# Patient Record
Sex: Female | Born: 1940 | Race: Black or African American | Hispanic: No | Marital: Single | Smoking: Never smoker
Health system: Southern US, Community
[De-identification: ages and names within clinical notes are randomized; demographics above are authoritative.]

## PROBLEM LIST (undated history)

## (undated) DIAGNOSIS — I219 Acute myocardial infarction, unspecified: Secondary | ICD-10-CM

## (undated) DIAGNOSIS — K851 Biliary acute pancreatitis without necrosis or infection: Secondary | ICD-10-CM

## (undated) DIAGNOSIS — I739 Peripheral vascular disease, unspecified: Secondary | ICD-10-CM

## (undated) DIAGNOSIS — D649 Anemia, unspecified: Secondary | ICD-10-CM

## (undated) DIAGNOSIS — I2699 Other pulmonary embolism without acute cor pulmonale: Secondary | ICD-10-CM

## (undated) DIAGNOSIS — F209 Schizophrenia, unspecified: Secondary | ICD-10-CM

## (undated) DIAGNOSIS — Q639 Congenital malformation of kidney, unspecified: Secondary | ICD-10-CM

## (undated) DIAGNOSIS — M1990 Unspecified osteoarthritis, unspecified site: Secondary | ICD-10-CM

## (undated) DIAGNOSIS — I10 Essential (primary) hypertension: Secondary | ICD-10-CM

## (undated) HISTORY — DX: Congenital malformation of kidney, unspecified: Q63.9

## (undated) HISTORY — PX: UMBILICAL HERNIA REPAIR: SHX196

## (undated) HISTORY — DX: Schizophrenia, unspecified: F20.9

## (undated) HISTORY — PX: KNEE SURGERY: SHX244

## (undated) HISTORY — PX: COLONOSCOPY: SHX174

## (undated) HISTORY — DX: Other pulmonary embolism without acute cor pulmonale: I26.99

## (undated) HISTORY — PX: ABDOMINAL HYSTERECTOMY: SHX81

## (undated) HISTORY — DX: Biliary acute pancreatitis without necrosis or infection: K85.10

## (undated) HISTORY — PX: CHOLECYSTECTOMY: SHX55

---

## 2002-12-30 ENCOUNTER — Ambulatory Visit (HOSPITAL_COMMUNITY): Admission: RE | Admit: 2002-12-30 | Discharge: 2002-12-30 | Payer: Self-pay | Admitting: Pulmonary Disease

## 2003-04-01 ENCOUNTER — Emergency Department (HOSPITAL_COMMUNITY): Admission: EM | Admit: 2003-04-01 | Discharge: 2003-04-01 | Payer: Self-pay | Admitting: Emergency Medicine

## 2003-04-01 ENCOUNTER — Encounter: Payer: Self-pay | Admitting: Emergency Medicine

## 2003-11-28 ENCOUNTER — Emergency Department (HOSPITAL_COMMUNITY): Admission: EM | Admit: 2003-11-28 | Discharge: 2003-11-28 | Payer: Self-pay | Admitting: Emergency Medicine

## 2003-12-12 ENCOUNTER — Ambulatory Visit (HOSPITAL_COMMUNITY): Admission: RE | Admit: 2003-12-12 | Discharge: 2003-12-12 | Payer: Self-pay | Admitting: Pulmonary Disease

## 2004-01-04 ENCOUNTER — Ambulatory Visit (HOSPITAL_COMMUNITY): Admission: RE | Admit: 2004-01-04 | Discharge: 2004-01-04 | Payer: Self-pay | Admitting: Orthopedic Surgery

## 2004-01-30 ENCOUNTER — Ambulatory Visit (HOSPITAL_COMMUNITY): Admission: RE | Admit: 2004-01-30 | Discharge: 2004-01-30 | Payer: Self-pay | Admitting: Pulmonary Disease

## 2004-04-19 ENCOUNTER — Inpatient Hospital Stay (HOSPITAL_COMMUNITY): Admission: AD | Admit: 2004-04-19 | Discharge: 2004-04-21 | Payer: Self-pay | Admitting: Internal Medicine

## 2004-11-17 DIAGNOSIS — K851 Biliary acute pancreatitis without necrosis or infection: Secondary | ICD-10-CM

## 2004-11-17 HISTORY — DX: Biliary acute pancreatitis without necrosis or infection: K85.10

## 2005-02-17 ENCOUNTER — Inpatient Hospital Stay (HOSPITAL_COMMUNITY): Admission: EM | Admit: 2005-02-17 | Discharge: 2005-02-26 | Payer: Self-pay | Admitting: Emergency Medicine

## 2005-02-18 ENCOUNTER — Ambulatory Visit: Payer: Self-pay | Admitting: Internal Medicine

## 2005-03-19 ENCOUNTER — Ambulatory Visit (HOSPITAL_COMMUNITY): Admission: RE | Admit: 2005-03-19 | Discharge: 2005-03-19 | Payer: Self-pay | Admitting: Internal Medicine

## 2005-03-24 ENCOUNTER — Ambulatory Visit (HOSPITAL_COMMUNITY): Admission: RE | Admit: 2005-03-24 | Discharge: 2005-03-24 | Payer: Self-pay | Admitting: General Surgery

## 2005-04-11 ENCOUNTER — Observation Stay (HOSPITAL_COMMUNITY): Admission: RE | Admit: 2005-04-11 | Discharge: 2005-04-12 | Payer: Self-pay | Admitting: General Surgery

## 2006-02-18 ENCOUNTER — Emergency Department (HOSPITAL_COMMUNITY): Admission: EM | Admit: 2006-02-18 | Discharge: 2006-02-18 | Payer: Self-pay | Admitting: Emergency Medicine

## 2006-04-04 ENCOUNTER — Emergency Department (HOSPITAL_COMMUNITY): Admission: EM | Admit: 2006-04-04 | Discharge: 2006-04-04 | Payer: Self-pay | Admitting: Emergency Medicine

## 2007-03-04 ENCOUNTER — Ambulatory Visit (HOSPITAL_COMMUNITY): Admission: RE | Admit: 2007-03-04 | Discharge: 2007-03-04 | Payer: Self-pay | Admitting: Podiatry

## 2007-09-14 ENCOUNTER — Ambulatory Visit (HOSPITAL_COMMUNITY): Admission: RE | Admit: 2007-09-14 | Discharge: 2007-09-14 | Payer: Self-pay | Admitting: Ophthalmology

## 2008-01-05 ENCOUNTER — Ambulatory Visit (HOSPITAL_COMMUNITY): Admission: RE | Admit: 2008-01-05 | Discharge: 2008-01-05 | Payer: Self-pay | Admitting: Pulmonary Disease

## 2008-09-12 ENCOUNTER — Ambulatory Visit: Admission: RE | Admit: 2008-09-12 | Discharge: 2008-09-12 | Payer: Self-pay | Admitting: Pulmonary Disease

## 2008-10-23 ENCOUNTER — Ambulatory Visit (HOSPITAL_COMMUNITY): Admission: RE | Admit: 2008-10-23 | Discharge: 2008-10-23 | Payer: Self-pay | Admitting: Neurology

## 2009-08-06 ENCOUNTER — Ambulatory Visit (HOSPITAL_COMMUNITY): Admission: RE | Admit: 2009-08-06 | Discharge: 2009-08-06 | Payer: Self-pay | Admitting: Pulmonary Disease

## 2010-04-04 ENCOUNTER — Ambulatory Visit (HOSPITAL_COMMUNITY)
Admission: RE | Admit: 2010-04-04 | Discharge: 2010-04-04 | Payer: Self-pay | Source: Home / Self Care | Admitting: Ophthalmology

## 2010-07-02 ENCOUNTER — Ambulatory Visit (HOSPITAL_COMMUNITY): Admission: RE | Admit: 2010-07-02 | Discharge: 2010-07-02 | Payer: Self-pay | Admitting: Pulmonary Disease

## 2010-09-03 ENCOUNTER — Ambulatory Visit (HOSPITAL_COMMUNITY): Admission: RE | Admit: 2010-09-03 | Discharge: 2010-09-03 | Payer: Self-pay | Admitting: Pulmonary Disease

## 2010-12-08 ENCOUNTER — Encounter: Payer: Self-pay | Admitting: Orthopedic Surgery

## 2011-04-04 NOTE — Procedures (Signed)
NAME:  Elgersma, Greer                ACCOUNT NO.:  [id number]   MEDICAL RECORD NO.:  [id number]          PATIENT TYPE:  OUT   LOCATION:  SLEEP                         FACILITY:  [facility]   PHYSICIAN:  Kofi A. Gerilyn Pilgrim, M.D. DATE OF BIRTH:  [birth year]   DATE OF PROCEDURE:  DATE OF DISCHARGE:                             SLEEP DISORDER REPORT   NOCTURNAL POLYSOMNOGRAPHY   REFERRING PHYSICIAN:  Edward L. Juanetta Gosling, MD   RECORDING DATE:  September 12, 2008.   INDICATION:  A 70 year old lady who presents with marked daytime  sleepiness snoring and has been evaluated for obstructive sleep apnea  syndrome.   MEDICATION:  Home O2.   Epworth sleepiness scale is 21.  BMI 25.   ARCHITECTURAL SUMMARY:  The total recording time is 400 minutes.  The  sleep efficiency 78%.  Sleep latency 9.5 minutes.  REM latency 83  minutes. Stage N1 8%, N2 75%, N3 2%, and REM sleep 15%.   RESPIRATORY SUMMARY:  The baseline oxygen saturation is 98 and the  lowest saturation is 92%.  The AHI is 8.   LIMB MOVEMENT SUMMARY:  PLM index 0.   ELECTROCARDIOGRAM SUMMARY:  Average heart rate is 57 with isolated PVCs  observed.   IMPRESSION:  Mild obstructive sleep apnea syndrome not requiring  positive pressure.   RECOMMENDATION:  Given the significant hypersomnia, we would suggest  sleep consultation.   Thanks for this referral.      Kofi A. Gerilyn Pilgrim, M.D.  Electronically Signed    KAD/MEDQ  D:  09/16/2008  T:  09/17/2008  Job:  [id number]

## 2011-04-04 NOTE — Group Therapy Note (Signed)
NAME:  [name] [name], [name]                ACCOUNT NO.:  [id number]   MEDICAL RECORD NO.:  [id number]          PATIENT TYPE:  INP   LOCATION:  A323                          FACILITY:  [facility]   PHYSICIAN:  Edward L. Juanetta Gosling, M.D.DATE OF BIRTH:  06-15-1941   DATE OF PROCEDURE:  02/24/2005  DATE OF DISCHARGE:                                   PROGRESS NOTE   [name] was admitted with biliary pancreatitis.  Thus far she has had  her stent placed and seems to be doing better but she is still not eating.  I am wondering if some of the problem is the fact that she has been off of  her antipsychotic so I am going to restart her Zyprexa.  On multiple  occasions she has been in my office complaining that she has lost weight,  has not eaten, etc. and it turns off that she is off of her antipsychotic  medication and that may be the problem here.  At any rate, I think it is  reasonable to go ahead and start her back on her Zyprexa and see if that  helps.  I have gone ahead and ordered that for today.   PHYSICAL EXAMINATION:  ABDOMEN:  Very soft.  CHEST:  Clear.  HEART:  Regular.  VITAL SIGNS:  Temperature 98.1, pulse 67, respirations 18, blood pressure  119/68.   LABORATORIES:  Lipase 89, amylase 113.  CBC:  White count 6400, hemoglobin  13.3, hematocrit 38.4.  Comprehensive metabolic panel shows sodium 128.  Liver functions are normal.   ASSESSMENT:  I am wondering if some of this, as mentioned, is related to not  taking her antipsychotic so I am going to restart that today.      ELH/MEDQ  D:  02/24/2005  T:  02/24/2005  Job:  [id number]

## 2011-04-04 NOTE — Group Therapy Note (Signed)
NAME:  [name], [name]                ACCOUNT NO.:  [id number]   MEDICAL RECORD NO.:  [id number]          PATIENT TYPE:  INP   LOCATION:  A323                          FACILITY:  [facility]   PHYSICIAN:  Edward L. Juanetta Gosling, M.D.DATE OF BIRTH:  Nov 21, 1940   DATE OF PROCEDURE:  DATE OF DISCHARGE:                                   PROGRESS NOTE   PROBLEM LIST:  Biliary pancreatitis.   SUBJECTIVE:  [name] [name] had a stent placed yesterday after ERCP and  initially felt better, but this morning, she says she is tired and weak.  She has no other complaints.  She is not really nauseated right now.   PHYSICAL EXAMINATION:  VITAL SIGNS:  Temperature 98.1, pulse 67,  respirations 20, blood pressure 124/57.  O2 sat 94% on room air.  ABDOMEN:  Soft.  EXTREMITIES:  Showed no edema.  CENTRAL NERVOUS SYSTEM:  Shows she is mildly anxious.   LABORATORY:  This morning, SGOT is 43, down from 79; SGPT 79, down from 114;  her albumin is also down to 2.7; amylase is 297, down from 574.   ASSESSMENT:  She is much improved.   PLAN:  To try to get her up and move around.  I am going to discontinue the  IV fluids, and I am hopeful that she will be able to be discharged soon.      ELH/MEDQ  D:  02/19/2005  T:  02/19/2005  Job:  [id number]

## 2011-04-04 NOTE — Discharge Summary (Signed)
NAME:  [name] [name], [name] [name]                ACCOUNT NO.:  [id number]   MEDICAL RECORD NO.:  [id number]          PATIENT TYPE:  INP   LOCATION:  A323                          FACILITY:  [facility]   PHYSICIAN:  Edward L. Juanetta Gosling, M.D.DATE OF BIRTH:  Oct 02, 1941   DATE OF ADMISSION:  02/17/2005  DATE OF DISCHARGE:  04/12/2006LH                                 DISCHARGE SUMMARY   DISCHARGE DIAGNOSES:  1.  Biliary pancreatitis.  2.  Schizophrenia.  3.  Chronic low back pain.  4.  History of pulmonary embolism.   HISTORY OF PRESENT ILLNESS:  [name] [name] is a 70 year old who came to the  emergency room complaining of abdominal discomfort and it was noted at that  time to have markedly elevated amylase, lipase, and had an ultrasound that  showed what appeared to be a stone impacted in the gallbladder.  She was  admitted because of all this, given IV fluids and antibiotics and had GI  consultation.  After GI consultation, she underwent a ERCP and stent  placement by Dr. Jena Gauss which improved her abdominal discomfort.  She was  somewhat slow to come around.  It was felt that at least part of that was  because she was off her chronic antipsychotic which is Zyprexa.  Zyprexa was  reinstituted and she got much better and was discharged home on April 12 on:   DISCHARGE MEDICATIONS:  1.  Zyprexa 5 mg at night.  2.  She is going to continue with her previous Vicodin one q.i.d. p.r.n.      pain.   She is going to be evaluated with a followup appointment at the GI office  for possible repeat ERCP and stent removal.   On admission, her physical examination showed that she was a thin female who  was in very marked abdominal distress and had also fairly marked tenderness  in the abdomen.      ELH/MEDQ  D:  02/26/2005  T:  02/27/2005  Job:  [id number]

## 2011-04-04 NOTE — Discharge Summary (Signed)
NAME:  [name] [name], [name] [name]                          ACCOUNT NO.:  [id number]   MEDICAL RECORD NO.:  [id number]                   PATIENT TYPE:  INP   LOCATION:  A212                                 FACILITY:  [facility]   PHYSICIAN:  Kingsley Callander. Ouida Sills, M.D.                  DATE OF BIRTH:  July 12, 1941   DATE OF ADMISSION:  04/19/2004  DATE OF DISCHARGE:  04/21/2004                                 DISCHARGE SUMMARY   DISCHARGE DIAGNOSES:  1. Syncope.  2. Weight loss.  3. Chronic back pain.  4. Elevated transaminases.  5. Possible urinary tract infection.   PROCEDURE:  1. CT scan of the abdomen and pelvis.  2. CT scan of the head.   HOSPITAL COURSE:  This patient is a 70 year old female who presented after a  syncopal episode.  There were no subsequent neurologic deficits.  A CT scan  of the head was negative.  She remained neurologically stable.  There was no  evidence of seizure activity.  Cardiac monitoring revealed no evidence of  dysrhythmia.  Her CBC was normal.  Her electrolytes were normal.  Her AST  was 42 and ALT was 48.  She complained of back pain which has evidentially  been a chronic problem.  She has used hydrocodone on a p.r.n. basis.   She describes a marked weight loss in recent months.  In light of her weight  loss and abnormal liver function studies, a CT scan of the abdomen and  pelvis was obtained which revealed no abnormality to explain her weight  loss.  It was discovered that she had previously had a CT scan of the chest,  abdomen, and pelvis earlier this year.  She had denied having any other  evaluation.  A hepatitis C antibody and a hepatitis B surface antigen have  been ordered to further evaluate her elevated transaminases.  On the morning  of June 5 though she was felt to be stable for discharge, with further  investigation into her weight loss as an outpatient.  She was advised to  have a colonoscopy which she had not previously had.  She also had a few  bacteria  and 7-10 white cells on urinalysis.  A urine culture is pending.  She is asymptomatic in this regard.   DISCHARGE MEDICATIONS:  Vicodin 1-2 q.6 hours p.r.n.   LABORATORY DATA:  Her TSH was normal at 0.4.     ___________________________________________                                         Kingsley Callander. Ouida Sills, M.D.   ROF/MEDQ  D:  04/21/2004  T:  04/21/2004  Job:  [id number]   cc:   Ramon Dredge L. Juanetta Gosling, M.D.  [address]  [place]  [place] [address]  Fax: [phone]

## 2011-04-04 NOTE — Group Therapy Note (Signed)
NAME:  [name] [name], [name] [name]                ACCOUNT NO.:  [id number]   MEDICAL RECORD NO.:  [id number]          PATIENT TYPE:  INP   LOCATION:  A323                          [facility]ILITY:  [facility]   PHYSICIAN:  Edward L. Juanetta Gosling, M.D.DATE OF BIRTH:  19-Jun-1941   DATE OF PROCEDURE:  02/22/2005  DATE OF DISCHARGE:                                   PROGRESS NOTE   PROBLEM:  Biliary pancreatitis.   SUBJECTIVE:  [name] [name] says she is okay and has no new complaints. She is  still nauseated. She has not had a bowel movement for several days.   OBJECTIVE:  VITAL SIGNS:  Her temperature is 98.4, pulse 67, respirations  18, blood pressure 125/81.  CHEST:  Her chest is clear.  ABDOMEN:  Minimally tender.  HEART:  Heart is regular.   Electrolytes were normal. SGPT, etc., are better.   ASSESSMENT:  She is better.   PLAN:  Continue current treatments. Continue medications. I am going to go  ahead and give her an enema to see if will make any difference with her  nausea.      ELH/MEDQ  D:  02/22/2005  T:  02/22/2005  Job:  [id number]

## 2011-04-04 NOTE — Group Therapy Note (Signed)
NAME:  Seaberg, Melizza                ACCOUNT NO.:  [id number]   MEDICAL RECORD NO.:  [id number]          PATIENT TYPE:  INP   LOCATION:  A323                          [facility]ILITY:  [facility]   PHYSICIAN:  Edward L. Juanetta Gosling, M.D.DATE OF BIRTH:  1941-06-14   DATE OF PROCEDURE:  DATE OF DISCHARGE:  02/26/2005                                   PROGRESS NOTE   PROBLEM:  Biliary pancreatitis.   SUBJECTIVE:  Ms. Renne says she is much improved.  She is able to eat now.  She has been getting up and moving around.   PHYSICAL EXAMINATION:  GENERAL:  She looks better.  ABDOMEN:  Much less tender.  CHEST:  Pretty clear.  HEART:  Regular.   ASSESSMENT:  She is improved.   PLAN:  Discharge her home.  Please see Discharge Summary for details.      ELH/MEDQ  D:  02/26/2005  T:  02/27/2005  Job:  [id number]

## 2011-04-04 NOTE — H&P (Signed)
NAME:  [name] [name], [name] [name]                          ACCOUNT NO.:  [id number]   MEDICAL RECORD NO.:  [id number]                   PATIENT TYPE:  INP   LOCATION:  A212                                 FACILITY:  [facility]   PHYSICIAN:  Kingsley Callander. Ouida Sills, M.D.                  DATE OF BIRTH:  [birth year]   DATE OF ADMISSION:  04/19/2004  DATE OF DISCHARGE:                                HISTORY & PHYSICAL   CHIEF COMPLAINT:  Passed out.   HISTORY OF PRESENT ILLNESS:  This patient is a 70 year old African-American  female who presented to the office complaining of passing out 2 days ago.  She states she has felt very weak and dizzy.  She has had increasingly  severe headaches for 2 weeks.  She has had an ongoing, unexplained weight  loss over the last few months.  She describes swelling in her legs.  She  complained of dropping things from her hands due to weakness.  She denies  having had any chest pain.  She has not had a history of cardiovascular  disease.  She denies any history of seizures.  She has not used any  substances.  There has been no trauma to her head.   PAST MEDICAL HISTORY:  1. Hysterectomy.  2. Deep vein thrombosis and PE.  3. Lumbar disk disease.   MEDICATIONS:  Vicodin which she states she takes only once a day.  She also  has bottles of Phenergan, Premarin, Sterapred, Lortab, Seroquel, and Indocin  but states she is not taking any of those.   SOCIAL HISTORY:  She has not smoked cigarettes, drank alcohol, or used  recreational drugs.  She is retired from farming.   FAMILY HISTORY:  Her mother died of an unknown type of cancer.  She is  unaware of her father's cause of death.   REVIEW OF SYSTEMS:  She describes a marked weight loss.  She shows a picture  from several months ago which clearly shows a much larger woman.  She denies  chest pain or abdominal pain.  She denies passing blood in her stools or  having vomiting.  She states she eats without difficulty.  She has no  problems voiding.   PHYSICAL EXAMINATION:  Blood pressure 112/72, pulse 84, weight 106,  respirations 14.  GENERAL:  Alert, oriented, thin female.  HEENT:  The mucous membranes appear pale.  She has partial dentures, no  sclero icterus no thyromegaly or cervical lymphadenopathy.  LUNGS:  Clear.  HEART:  Regular with no murmurs.  ABDOMEN:  Nontender, with no hepatosplenomegaly.  She does have a healed  midline vertical hysterectomy scar.  No CVA tenderness.  EXTREMITIES:  Pulses are palpable.  No clubbing or edema.  NEURO:  Her Romberg is mildly unsteady.  Her gait is fairly stable.  She has  no unilateral weakness.  The pupils are equal, round and reactive to  light.  Extraocular movements intact.  Speech is not slurred.  Lymph nodes:  No  enlargement of the cervical or supraclavicular or axillary nodes.   IMPRESSION:  1. Syncope.  2. Weight loss.  3. Headaches.   PLAN:  Head CT, cardiac monitoring, CBC, comp, TSH, EKG, cardiac enzymes.     ___________________________________________                                         Kingsley Callander. Ouida Sills, M.D.   ROF/MEDQ  D:  04/19/2004  T:  04/19/2004  Job:  [id number]   cc:   Ramon Dredge L. Juanetta Gosling, M.D.  [address]  [place]  [place] [address]  Fax: [phone]

## 2011-04-04 NOTE — Op Note (Signed)
[name] [name], ROSELLO                ACCOUNT NO.:  [id number]   MEDICAL RECORD NO.:  [id number]          PATIENT TYPE:  AMB   LOCATION:  DAY                           FACILITY:  [facility]   PHYSICIAN:  Barbaraann Barthel, M.D. DATE OF BIRTH:  December 12, 1940   DATE OF PROCEDURE:  04/11/2005  DATE OF DISCHARGE:                                 OPERATIVE REPORT   SURGEON:  Dr. Malvin Johns.   PREOPERATIVE DIAGNOSIS:  1.  Cystitis cholelithiasis (status post ERCP and common bile duct stone      extraction).  2.  Umbilical hernia   PROCEDURE:  1.  Laparoscopic cholecystectomy.  2.  Repair of umbilical hernia.   SPECIMEN:  Gallbladder with stones.   NOTE:  This is a 70 year old black female who was seen for biliary  pancreatitis by the GI service. She underwent an ERCP and stone extraction  with stent placement in April 2006. She was then referred to me for interval  cholecystectomy for cholelithiasis. We had discussed the procedure with her  in detail, discussing complications not limited to but including bleeding,  infection, damage to bile duct, perforation of organs and the possibility of  that an open procedure might be required. Informed consent was obtained.  Also on clinical examination, she had an umbilical hernia which I told her I  would repair at the same surgery.   GROSS OPERATIVE FINDINGS:  The patient had some adhesions about her  umbilical hernia. A small cystic duct with no stones noted within it,  possibly small stones within the gallbladder as well. No other abnormalities  were encountered other than the adhesions from her previous surgery which  essentially was an abdominal hysterectomy.   TECHNIQUE:  The patient was placed in supine position, and after the  adequate administration of general anesthesia via endotracheal intubation a  Foley catheter was aseptically inserted and the patient was placed in  Trendelenburg. After prepping and draping in the usual manner, we  performed  a periumbilical incision on the superior aspect of the umbilicus, dissecting  through the umbilical hernia and then carefully placing the cannula using  the Visiport technique in the umbilicus incision. I then made another  incision in the epigastrium and placed an 11-mm cannula with the camera in  this port, and I placed a 5-mm cannula right upper quadrant laterally. We  took down the adhesions around the umbilicus to allow greater flexibility,  and this was done without problems. We then were able to visualize well the  area of the right upper quadrant. We placed another 5-mm cannula in the  right upper quadrant laterally, grasped the gallbladder's fundus and towards  Hartmann's pouch and then dissected the cystic duct which was clearly  visualized, triply silver clipped on the side of the common bile duct, and  doubly silver clipped on the side of the gallbladder. We then identified the  cystic artery which was likewise triply silver clipped, and the gallbladder  was removed from the liver bed using the hook cautery device. We then placed  the gallbladder in the EndoCatch device, removed this through  the epigastric  incision, and then after checking for hemostasis, we irrigated with normal  saline solution, cauterizing small areas of oozing in the liver with cautery  device. We then placed a Surgicel in the liver bed, and then I elected to  leave a Jackson-Pratt drain the right upper quadrant. This was exited  through one of the 5-mm cannula sites in the right upper quadrant. We then  desufflated the abdomen, and attention was then turned to the umbilical  hernia which was repaired using figure-of-eight sutures of 0 Prolene. We  then used 1/2% Sensorcaine all surgical sites for postoperative comfort,  closed the fascia in the area of the epigastric incision with 0 Polysorb  suture, and then closed all wounds with a stapling device. The drain was  sutured in place with 3-0  nylon. Prior to closure, all sponge, needle and  instrument counts were found to be correct. Estimated blood loss was  minimal. The patient received 1,100 cc of crystalloids intraoperatively. A  drain was placed in the right upper quadrant. There were no complications.       WB/MEDQ  D:  04/11/2005  T:  04/11/2005  Job:  [id number]   cc:   R. Roetta Sessions, M.D.  [address]  [place]  [place] [address]

## 2011-04-04 NOTE — Consult Note (Signed)
NAME:  [name] [name], [name] [name]                ACCOUNT NO.:  [id number]   MEDICAL RECORD NO.:  [id number]          PATIENT TYPE:  INP   LOCATION:  A323                          FACILITY:  [facility]   PHYSICIAN:  R. Roetta Sessions, M.D. DATE OF BIRTH:  1941/02/13   DATE OF CONSULTATION:  DATE OF DISCHARGE:                                   CONSULTATION   REASON FOR CONSULTATION:  Biliary pancreatitis.   PHYSICIAN REQUESTING CONSULTATION:  Edward L. Juanetta Gosling, M.D.   HISTORY OF PRESENT ILLNESS:  The patient is a 70 year old black female who  presented to the emergency department yesterday via EMS with complaints of  acute onset of upper abdominal pain with nausea and vomiting.  Sunday  evening she ate several tacos and within a couple of hours developed acute  onset of upper abdominal pani followed by nausea and vomiting.  She was  vomiting every 15-20 minutes.  She reports brown emesis but no frank blood.  Eventually EMS was notified.  She was brought to the emergency department  where she was found to have elevated LFTs.  Her total bilirubin was 0.8,  alkaline phosphatase 67, AST 247, ALT 187, albumin 3.3, lipase 1011.  White  count was normal at 7800 with 82% neutrophils.  She had abdominal ultrasound  yesterday which revealed sludge within the gallbladder, common gallbladder  wall of 6 mm with small amount of pericholecystic fluid.  Her common bile  duct diameter was 9 mm with possible common bile duct stone.  There was mild  anterior herpetic ductal dilatation.  She had a nodular-appearing liver.  A  solitary left kidney.  This morning her LFTs are improved.  Her total  bilirubin and alkaline phosphatase remained normal.  Her AST is 79, ALT 114.  Her amylase is 574, lipase 291.  She continues to have upper abdominal pain  and nausea.  She has had no vomiting today.  When these symptoms began she  did have a few loose stools, however, she states these have checked up.  No  melena, no rectal bleeding.   She has had some heart burn in the last few  days.  Denies any fever or chills.   CURRENT MEDICATIONS:  Vicodin p.r.n.  Premarin.  Zyprexa 5 mg q.h.s.   ALLERGIES:  No known drug allergies.   PAST MEDICAL HISTORY:  Per medical record.  She has a history of DVT and  pulmonary embolus.  She has been diagnosed with schizophrenia.  She has  lumbar disc disease.  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  She is status posthysterectomy.   FAMILY HISTORY:  Mother died of cancer of unknown type.   SOCIAL HISTORY:  She lives alone.  She has a son.  Denies any tobacco or  alcohol use.  She is retired from working on the farm.   REVIEW OF SYSTEMS:  See HPI for GI.  Cardiopulmonary:  Denies any chest pain  or shortness of breath.  Constitutional:  Denies any fever.   PHYSICAL EXAMINATION:  VITAL SIGNS:  Temperature 98.1, pulse 69,  respirations 20, blood pressure 133/75.  GENERAL:  A pleasant, elderly white female in no acute distress.  SKIN:  Warm, dry, no jaundice.  HEENT:  Sclerae nonicteric.  Pupils equal, round, and reactive to light.  Oropharyngeal mucosa moist and pink, no lesions, erythema or exudate.  CHEST:  Lungs are clear to auscultation.  CARDIAC:  Regular rate and rhythm.  Normal S1 and S2.  No murmur, rub, or  gallop.  ABDOMEN:  Positive bowel sounds, soft, nondistended.  She has moderate  tenderness throughout the upper abdomen especially left upper quadrant to  deep palpation.  No organomegaly or masses.  No rebound tenderness or  guarding.  EXTREMITIES:  No edema.   LABORATORY DATA:  As mentioned in the HPI.  In addition, hemoglobin 12.7,  hematocrit 36.3, platelets 165,000.  Sodium 135, potassium 3.9, BUN 6,  creatinine 0.6, glucose 134. Urinalysis revealed a moderate amount of blood,  11-20 rbc's per high power field, greater than 300 protein.   IMPRESSION:  The patient is a 70 year old lady who presented with  acute  onset of upper abdominal pain, nausea and vomiting.  She has biliary  pancreatitis with possible common bile duct stone and acute cholecystitis  based on ultrasound.  Her LFTs have improved, therefore, she may have  already passed her stone or have a partial obstruction.  There is no  evidence of cholangitis at this time.   RECOMMENDATIONS:  1.  Will review abdominal ultrasound.  2.  Possible ERCP if there is evidence of common bile duct stone.  3.  Further recommendations to follow.  4.  Continue supportive measures and NPO status.  5.  Agree with antibiotic coverage for cholangitis.      LL/MEDQ  D:  02/18/2005  T:  02/18/2005  Job:  [id number]   cc:   Ramon Dredge L. Juanetta Gosling, M.D.  [address]  [place]  [place] [address]  Fax: [phone]

## 2011-04-04 NOTE — Group Therapy Note (Signed)
NAME:  [name] [name], [name] [name]                ACCOUNT NO.:  [id number]   MEDICAL RECORD NO.:  [id number]          PATIENT TYPE:  INP   LOCATION:  A323                          FACILITY:  [facility]   PHYSICIAN:  Edward L. Juanetta Gosling, M.D.DATE OF BIRTH:  October 18, 1941   DATE OF PROCEDURE:  DATE OF DISCHARGE:                                   PROGRESS NOTE   Ms. Bardwell says that she is feeling better than she did yesterday  afternoon.  She did have her stent placed and she says things are going  well.   SUBJECTIVE:  Pulse 65, respirations 23, blood pressure 144/82, O2 saturation  is 94% on room air. Her chest is clear.  Her abdomen much softer.  She looks  better in general.  Her white count is 14,400, hemoglobin is 11.4, potassium  is 3.1 and that will need to be replaced.  SGOT 58 which is better, amylase  58 which is better.   PLAN:  The plan is to continue treatments, continue meds, followup, advance  her diet somewhat and then she may be able to be discharged, perhaps  tomorrow if she is okay.      ELH/MEDQ  D:  02/20/2005  T:  02/20/2005  Job:  [id number]

## 2011-04-04 NOTE — H&P (Signed)
NAME:  [name] [name], [name] [name]                ACCOUNT NO.:  [id number]   MEDICAL RECORD NO.:  [id number]          PATIENT TYPE:  INP   LOCATION:  A323                          FACILITY:  [facility]   PHYSICIAN:  Edward L. Juanetta Gosling, M.D.DATE OF BIRTH:  Jun 18, 1941   DATE OF ADMISSION:  02/17/2005  DATE OF DISCHARGE:  LH                                HISTORY & PHYSICAL   HISTORY OF PRESENT ILLNESS:  Ms. Higham is a 71 year old who presented to  the emergency room with abdominal discomfort.  She has had a long-known  history of mental illness.  She has had problems with a pulmonary embolus in  the past, and she had had weight loss, but her weight loss generally seems  to occur when she stops taking her antipsychotic medications.  She came to  the emergency room this time with severe abdominal pain, nausea, and  vomiting.  After evaluation in the emergency room, she was found to have  what appears to be biliary pancreatitis.  She has elevated amylase and  lipase, and has elevated liver functions, as well.  She underwent a number  of studies in the emergency room.  None of them have been diagnostic.   PAST MEDICAL HISTORY:  1.  Deep vein thrombosis.  2.  Pulmonary embolism.  3.  Lumbar disk disease.  4.  Hysterectomy.  5.  Significant mental illness, which has been diagnosed as schizophrenia in      the past.   MEDICATION LIST:  Her medication list is not clear.  She is on -  1.  Zyprexa 5 mg at bedtime.  2.  She has been on Vicodin in the past.   SOCIAL HISTORY:  She lives in a low-income apartment.  She does not smoke.  She does not drink any alcohol.  She worked on a farm.   FAMILY HISTORY:  Her mother died of cancer.  It is not clear what that was.  Her father died of causes that are not clear.   REVIEW OF SYSTEMS:  Otherwise negative.  She has not had any previous  similar symptoms.   PHYSICAL EXAMINATION:  GENERAL:  A thin female who is obviously nauseated.  VITAL SIGNS:  Temperature is  97.2, pulse 63, respirations 20, blood pressure  125/78.  HEENT:  Her mucous membranes are dry.  Her nose and throat are clear.  ABDOMEN:  Fairly soft.  Bowel sounds are present and active.  EXTREMITIES:  No edema.  CNS:  Grossly intact.   LABORATORY DATA:  Her urine is negative.  Lipase was 1011.  Comprehensive  metabolic profile revealed a glucose of 134, SGOT of 247, SGPT of 187.  CBC  revealed a white count of 7800, hemoglobin 12.7.  She had an abdominal  ultrasound which shows a dilated bile duct with a questionable echogenic  focus in the common bile duct intrahepatic biliary dilatation.  Sludge in  the gallbladder.  Liver was somewhat irregular.  The assessment at that time  was that she probable had biliary pancreatitis.   She was admitted for IV fluids, antibiotics, etc.  After she has settled  down a bit, she will need to see one of the surgeons, and I am going to get  a GI consultation, as well.  I am going to go ahead and order that.      ELH/MEDQ  D:  02/17/2005  T:  02/17/2005  Job:  [id number]

## 2011-04-04 NOTE — Op Note (Signed)
NAME:  [name] [name], [name] [name]                ACCOUNT NO.:  [id number]   MEDICAL RECORD NO.:  [id number]          PATIENT TYPE:  AMB   LOCATION:  DAY                           FACILITY:  [facility]   PHYSICIAN:  R. Roetta Sessions, M.D. DATE OF BIRTH:  11/03/1941   DATE OF PROCEDURE:  03/19/2005  DATE OF DISCHARGE:                                 OPERATIVE REPORT   PROCEDURE:  Endoscopic retrograde cholangiopancreatography with stent  removal, sphincterotomy, stone extraction.   INDICATIONS FOR PROCEDURE:  The patient is a 70 year old lady who presented  early this month with biliary pancreatitis. She was found to have  choledocholithiasis confirmed on MRCP. She underwent an ERCP which revealed  sludge and stone debris in the CBD.  Lack of a good intramural segment  limited sphincterotomy. A 10 French stent was placed, she has done well. Her  LFT's  were normal today and she comes for stent removal. Ultimately she  will need a cholecystectomy. I discussed repeating the ERCP with her with  stent removal and possible sphincterotomy and stone extraction. We talked  about a 1/10 chance of pancreatitis, possibility of other complications  including but not limited to bleeding, perforation, reaction to medications,  failed procedure and need for another ERCP. All questions were answered and  she is agreeable. Please see documentation in the medical record.   DESCRIPTION OF PROCEDURE:  The patient was placed in semiprone position on  the OR table after general anesthesia was induced by Dr. Jayme Cloud and  associates.   INSTRUMENT:  Olympus video chip system.   Levaquin 250 mg IV prior to the procedure.   FINDINGS:  Cursory examination of the distal esophagus, stomach and duodenum  revealed no abnormalities aside from previously placed indwelling stent  protruding from the ampullary orifice. The scope was pulled back to the  short position 55 cm from the incisors, scout film was taken. Using the  snare  through the scope, the stent was removed. The scope was reintroduced  in the duodenum using the bard sphincterotome. A deep cannulation into the  biliary tree was easily obtained, cholangiogram was performed. The  sphincterotomy appeared to be relatively modest and somewhat scarred down.  There is question of a single distal filling defect in the CBD. (Please note  there were small pebbles seen in the side ports of the stent which was  removed intact.)  After cholangiogram was performed, I elected to extend the  sphincterotomy, a safety wire was placed deep in the biliary tree under  fluoroscopic control. The sphincterotome was pulled back across the  ampullary orifice to the 12 o'clock position and using the ERBE unit,  sphincterotomy was extended approximately 4 mm. This was associated with  spontaneous delivery of three 2-3 mm stones. Subsequently the sphincterotome  was railed off the guidewire and a graduated balloon was placed deep in the  biliary tree on the guidewire under fluoroscopic control. A balloon  occlusion cholangiogram was obtained x2 with recovery of a 7 mm piston  shaped stone and additional stone debris. There appeared to be excellent  drainage of the  biliary tree after this maneuver without any residual  filling defects being seen. The patient apparently tolerated the procedure  well and was recovered in the PACU. The pancreatic duct was not injected.   IMPRESSION:  Evidence of prior sphincterotomy status post extension of  sphincterotomy, cholangiogram revealing multiple common duct stones status  post balloon extraction as described above.   RECOMMENDATIONS:  1.  No aspirin or arthritis medications for the next 10 days.  2.  The patient ought to go ahead and have an elective laparoscopic      cholecystectomy in the near future. Will facilitate referral to the      surgeon of her choosing.      RMR/MEDQ  D:  03/19/2005  T:  03/19/2005  Job:  [id number]   cc:    Ramon Dredge L. Juanetta Gosling, M.D.  [address]  [place]  [place] [address]  Fax: [phone]

## 2011-04-04 NOTE — Group Therapy Note (Signed)
NAME:  [name], [name]                ACCOUNT NO.:  [id number]   MEDICAL RECORD NO.:  [id number]          PATIENT TYPE:  INP   LOCATION:  A323                          FACILITY:  [facility]   PHYSICIAN:  Edward L. Juanetta Gosling, M.D.DATE OF BIRTH:  [birth year]   DATE OF PROCEDURE:  02/18/2005  DATE OF DISCHARGE:                                   PROGRESS NOTE   PROBLEM LIST:  Biliary pancreatitis.   SUBJECTIVE:  [name] [name] says she is much better this morning.  She is not  as nauseated.  She is not vomiting anymore.  She feels better.   OBJECTIVE:  VITAL SIGNS:  Temperature 98.1, pulse 69, respirations 20, blood  pressure 133/75.  ABDOMEN:  She seems improved as far as her abdominal exam is concerned.  She  is much softer.  CHEST:  Clearer.  HEART:  Regular.   Lab work this morning shows her SGPT is 144, SGOT 77 compared to 247 and 187  respectively yesterday.  Her amylase this morning is 574 and her lipase is  291 improved.   ASSESSMENT:  She seems better.   PLAN:  Continue with her treatments.  Consult is underway.      ELH/MEDQ  D:  02/18/2005  T:  02/18/2005  Job:  [id number]

## 2011-04-04 NOTE — Op Note (Signed)
NAME:  [name] [name], [name] [name]                ACCOUNT NO.:  [id number]   MEDICAL RECORD NO.:  [id number]          PATIENT TYPE:  INP   LOCATION:  A323                          FACILITY:  [facility]   PHYSICIAN:  R. Roetta Sessions, M.D. DATE OF BIRTH:  [birth year]   DATE OF PROCEDURE:  02/18/2005  DATE OF DISCHARGE:                                 OPERATIVE REPORT   PROCEDURE:  1.  Endoscopic retrograde cholangiopancreatography with sphincterotomy.  2.  Stone extraction and stent placement.   INDICATIONS FOR PROCEDURE:  The patient is a 70 year old lady with admitted  to the hospital with biliary pancreatitis, bile ducts dilated, question of  common bile duct stone on ultrasound.  Transaminases significantly elevated.  ERCP is now being done. This approach has been discussed with the patient at  length.  The potential risks, benefits and alternatives have been reviewed  and questions answered.  _______ chance of pancreatitis, reaction to  medication, perforation, bleeding, potential for failed procedure and  potential for stent placement.  All questions were answered.  Al parties  were agreeable.   DESCRIPTION OF PROCEDURE:  The patient was placed in the semi-prone position  on the OR table after endotracheal tube anesthesia was induced by Dr.  Jayme Cloud and associates.  Instrument was the Olympus video chip system.   FINDINGS:  The distal esophagus and stomach revealed no abnormalities.  Examination of the bulb and second portion revealed no abnormalities.  The  ampulla of Vater was identified and the medial wall and second portion of  the duodenum.  It was somewhat tented by redundant to edema mucosal folds.  I was able to tease the mucosa folds back and get a fairly good  visualization of the ampulla.  Scout film was taken in the short position at  55 cm from incisors.  Using the Bard sphincterotome, the ampulla was  approached and injections were made with a combination of guidewire  palpation.   _______Was initially injected, course and caliber appeared  normal.  A more tangential approach was taken for biliary cannulation.  A  deep cannulation was obtained with the guidewire and was followed with the  sphincterotome.  Injections were made.  The patient has cholelithiasis and a  3 mm filling defect distal duct.  Leaving the safety wire deep in the  biliary tree under fluoroscopic control, the sphincterotome was pulled  across the ampulla orifice and sphincterotomy was performed.  The redundant  duodenum folds kept getting in the way and I had to limit the size of the  sphincterotomy.  I placed a graduated balloon over the wire, deep ______ and  dredged the duct, recovered with some sludge and stone fragments.  It was  notable that the bile duct just did not appear to drain adequately after  this maneuver (it was performed x2).  Subsequently, I elected to go ahead  and place a 10-French, 5 cm stent.  This was railed over a guiding catheter  under fluoroscopic control and placed in excellent position.  The patient  tolerated the procedure well and was reactive in the PACU.  IMPRESSION:  1.  Choledocholithiasis/cholelithiasis.  2.  Status post sphincterotomy and balloon dredging of the bile duct.  3.  Poor draining of the bile duct, necessitating stent placement described      above.  4.  Pancreatic duct appeared grossly normal.   RECOMMENDATIONS:  1.  Check labs tomorrow morning.  2.  Have the stent removed in several weeks.  3.  Consider elective cholecystectomy.      RMR/MEDQ  D:  02/18/2005  T:  02/19/2005  Job:  [id number]   cc:   Ramon Dredge L. Juanetta Gosling, M.D.  [address]  [place]  [place] [address]  Fax: [phone]

## 2011-05-23 ENCOUNTER — Inpatient Hospital Stay (HOSPITAL_COMMUNITY)
Admission: EM | Admit: 2011-05-23 | Discharge: 2011-05-26 | DRG: 923 | Disposition: A | Discharge status: Home / Self Care | Payer: PRIVATE HEALTH INSURANCE | Attending: Pulmonary Disease | Admitting: Pulmonary Disease

## 2011-05-23 ENCOUNTER — Other Ambulatory Visit: Payer: Self-pay

## 2011-05-23 DIAGNOSIS — Z86718 Personal history of other venous thrombosis and embolism: Secondary | ICD-10-CM

## 2011-05-23 DIAGNOSIS — F209 Schizophrenia, unspecified: Secondary | ICD-10-CM | POA: Diagnosis present

## 2011-05-23 DIAGNOSIS — E860 Dehydration: Secondary | ICD-10-CM | POA: Diagnosis present

## 2011-05-23 DIAGNOSIS — T675XXA Heat exhaustion, unspecified, initial encounter: Principal | ICD-10-CM | POA: Diagnosis present

## 2011-05-23 DIAGNOSIS — R000 Tachycardia, unspecified: Secondary | ICD-10-CM | POA: Diagnosis present

## 2011-05-23 DIAGNOSIS — X30XXXA Exposure to excessive natural heat, initial encounter: Secondary | ICD-10-CM | POA: Diagnosis present

## 2011-05-23 LAB — BASIC METABOLIC PANEL
CO2: 28 mEq/L (ref 19–32)
Creatinine, Ser: 0.72 mg/dL (ref 0.50–1.10)

## 2011-05-23 LAB — TROPONIN I: Troponin I: <0.3 ng/mL (ref <0.30)

## 2011-05-23 LAB — DIFFERENTIAL
Basophils Absolute: 0 10*3/uL (ref 0.0–0.1)
Basophils Relative: 0 % (ref 0–1)
Eosinophils Absolute: 0 10*3/uL (ref 0.0–0.7)
Neutrophils Relative %: 60 % (ref 43–77)

## 2011-05-23 LAB — CBC
Platelets: 147 10*3/uL — ABNORMAL LOW (ref 150–400)
RBC: 4.24 MIL/uL (ref 3.87–5.11)
WBC: 5.8 10*3/uL (ref 4.0–10.5)

## 2011-05-23 LAB — CK TOTAL AND CKMB (NOT AT ARMC)
CK, MB: 11.1 ng/mL (ref 0.3–4.0)
CK, MB: 8 ng/mL (ref 0.3–4.0)
Relative Index: 3.4 — ABNORMAL HIGH (ref 0.0–2.5)
Relative Index: 3.3 — ABNORMAL HIGH (ref 0.0–2.5)
Total CK: 323 U/L — ABNORMAL HIGH (ref 7–177)
Total CK: 246 U/L — ABNORMAL HIGH (ref 7–177)

## 2011-05-23 MED ORDER — ASPIRIN 81 MG PO CHEW
81.0000 mg | CHEWABLE_TABLET | Freq: Every day | ORAL | Status: DC
  Administered 2011-05-24 – 2011-05-26 (×3): 81 mg via ORAL
  Filled 2011-05-23 (×3): qty 1

## 2011-05-23 MED ORDER — METOPROLOL TARTRATE 12.5 MG HALF TABLET
12.5000 mg | ORAL_TABLET | Freq: Two times a day (BID) | ORAL | Status: DC
  Administered 2011-05-24 – 2011-05-25 (×3): 12.5 mg via ORAL
  Filled 2011-05-23 (×5): qty 1

## 2011-05-23 MED ORDER — LORAZEPAM 1 MG PO TABS: 1.0000 mg | ORAL_TABLET | Freq: Every evening | ORAL | Status: DC | PRN

## 2011-05-23 MED ORDER — SODIUM CHLORIDE 0.9 % IV SOLN
INTRAVENOUS | Status: DC
  Administered 2011-05-24 – 2011-05-25 (×4): via INTRAVENOUS

## 2011-05-23 MED ORDER — ACETAMINOPHEN 500 MG PO TABS: 500.0000 mg | ORAL_TABLET | ORAL | Status: DC | PRN

## 2011-05-24 ENCOUNTER — Other Ambulatory Visit: Payer: Self-pay

## 2011-05-24 NOTE — H&P (Signed)
[name] Long, [name] Long                ACCOUNT NO.:  [id number]  MEDICAL RECORD NO.:  [id number]  LOCATION:  A331                          FACILITY:  [facility]  PHYSICIAN:  Mila Homer. Sudie Bailey, M.D.DATE OF BIRTH:  July 18, 1941  DATE OF ADMISSION:  05/23/2011 DATE OF DISCHARGE:  LH                             HISTORY & PHYSICAL   HISTORY OF PRESENT ILLNESS:  This 70 year old woman was feeling fine today.  She walked up to her mailbox, during "the heat of the day" and finding nothing there, went to visit with a friend of hers who lives a house or two away from hers on [address].  She was sitting on the porch when she developed some nausea.  He went inside to get her some water and then she continued to have more nausea and then said she blacked out for couple of minutes.  She denied palpitations.  She had no chest pain.  No radicular pain.  No neck pain.  She has generally been healthy.  She had a problem with her gallbladder a few years ago and required a cholecystectomy but also required ERCP with a sphincterotomy prior to that.  She has had a pulmonary embolus in the past and also been treated for schizophrenia by Dr. Juanetta Gosling, her LMD.  FAMILY HISTORY:  Both her parents died of cancer.  She had a brother die of cancer.  Another one had diabetes and lost a leg is currently in the [facility] nursing home because of this.  She has a son, 2  grandchildren, at least 2 great-grandchildren.  MEDICATIONS:  Other than Premarin we had no other medications that she was on.  EXAMINATION ON ADMISSION:  VITAL SIGNS:  Showed a pleasant elderly woman, temperature is 97.7, blood pressure 111/56, pulse 61, respiratory rate 21, O2 sat 97%. GENERAL:  At the time I saw her she was well-developed and thin, in no acute distress and had no further nausea or other symptoms.  Her speech was normal, sentence structure intact.  She is a good historian. HEENT:  Mucous membranes are moist.  __________ the nose. LUNGS:   Clear throughout. CARDIOVASCULAR:  Regular rhythm and rate of about 70. ABDOMEN:  Soft without organomegaly or mass. EXTREMITIES:  She had no edema of the distal legs or ankles.  The feet were warm but I could not palpate posterior tibial or dorsalis pedis pulses.  LABS:  EKG showed a rate of 58 with a sinus bradycardia but there were inverted T-waves anterolaterally and inferiorly. Blood tests are pending.  ADMISSION DIAGNOSES: 1. Syncopal episode associated with changes on the EKG. 2. Question coronary artery syndrome. 3. Schizophrenia. 4. Status post cholecystectomy. 5. Status post biliary pancreatitis secondary to retained gallstone in     the common bile duct.  PLAN:  She will be admitted to hospital on a cardiac monitor with cardiac enzymes done within the next couple of hours and also in the morning along with a 12-lead EKG and be seen by her own doctor, Dr. Juanetta Gosling in the morning.  ADDENDUM:  I reviewed a discharge summary from 7 years ago, June 2005,in which she had a syncopal episode of unknown cause.  Mila Homer. Sudie Bailey, M.D.     SDK/MEDQ  D:  05/23/2011  T:  05/24/2011  Job:  [id number]

## 2011-05-25 NOTE — Progress Notes (Signed)
[id number]  

## 2011-05-25 NOTE — Group Therapy Note (Signed)
NAMEEVADNE, OSE                ACCOUNT NO.:  [id number]  MEDICAL RECORD NO.:  [id number]  LOCATION:  A331                          FACILITY:  [facility]  PHYSICIAN:  Mila Homer. Sudie Bailey, M.D.DATE OF BIRTH:  January 28, 1941  DATE OF PROCEDURE: DATE OF DISCHARGE:                                PROGRESS NOTE   SUBJECTIVE:  The patient is admitted to the hospital after having a syncopal episode two nights ago.  She is feeling good deal better.  OBJECTIVE:  Her temperature is 98.1, pulse 52, respiratory rate 20, blood pressure 109/61, most recently.  02 SATs 97%.  She is in no acute distress.  Well-developed, well-nourished, appears to be oriented and alert.  She is sitting in bed.  Her heart has regular rhythm, rate about 70.  LUNGS:  Clear throughout and she is moving air well.  ASSESSMENT: 1. Syncopal episode, question etiology. 2. Question coronary artery syndrome. 3. Schizophrenia. 4. Status post biliary pancreatitis.  PLAN:  Will cut down her IVs at this point, switch to Hep-Lock and hopefully she will be, if asymptomatic, ready to be discharged home tomorrow.     Mila Homer. Sudie Bailey, M.D.     SDK/MEDQ  D:  05/25/2011  T:  05/25/2011  Job:  [id number]

## 2011-05-26 MED ORDER — ASPIRIN 81 MG PO CHEW: 81.0000 mg | CHEWABLE_TABLET | Freq: Every day | ORAL | Status: AC

## 2011-05-26 MED ORDER — METOPROLOL TARTRATE 25 MG PO TABS: 12.5000 mg | ORAL_TABLET | Freq: Two times a day (BID) | ORAL | Status: DC

## 2011-05-26 MED ORDER — METOPROLOL TARTRATE 12.5 MG HALF TABLET
12.5000 mg | ORAL_TABLET | Freq: Two times a day (BID) | ORAL | Status: DC
  Administered 2011-05-26: 12.5 mg via ORAL
  Filled 2011-05-26: qty 1

## 2011-05-26 MED ORDER — METOPROLOL TARTRATE 12.5 MG HALF TABLET: 12.5000 mg | ORAL_TABLET | Freq: Two times a day (BID) | ORAL | Status: DC

## 2011-05-26 MED ORDER — METOPROLOL TARTRATE 12.5 MG HALF TABLET
12.5000 mg | ORAL_TABLET | Freq: Two times a day (BID) | ORAL | Status: DC
  Filled 2011-05-26: qty 1

## 2011-05-26 NOTE — Group Therapy Note (Signed)
NAMEELADIA, FRAME                ACCOUNT NO.:  [id number]  MEDICAL RECORD NO.:  [id number]  LOCATION:  A331                          FACILITY:  [facility]  PHYSICIAN:  Trystyn Sitts L. Juanetta Gosling, M.D.DATE OF BIRTH:  19-Nov-1940  DATE OF PROCEDURE:  05/24/2011 DATE OF DISCHARGE:                                PROGRESS NOTE   Ms. Pizzini was admitted yesterday with a syncopal episode.  She says she is doing okay and has no new complaints now.  She has not had any more syncope.  Her exam shows that she is awake and alert.  She has not had any orthostasis.  Her chest is clear.  Heart is regular.  ASSESSMENT:  She has multiple medical problems and has had syncope, I think probably related to dehydration.  She is receiving IV fluids and I will continue that, I think she is probably going to need another 24-48 hours of IV fluids.  Continue to check her orthostatic changes.  No other new medicines and see how she does.  She is ruled out for any further severe problems.     Stephene Alegria L. Juanetta Gosling, M.D.     ELH/MEDQ  D:  05/25/2011  T:  05/26/2011  Job:  [id number]

## 2011-05-26 NOTE — Group Therapy Note (Signed)
NAMEANNIKAH, LOVINS                ACCOUNT NO.:  [id number]  MEDICAL RECORD NO.:  [id number]  LOCATION:  A331                          FACILITY:  [facility]  PHYSICIAN:  Ogechi Kuehnel L. Juanetta Gosling, M.D.DATE OF BIRTH:  03-18-1941  DATE OF PROCEDURE:  05/26/2011 DATE OF DISCHARGE:  05/26/2011                                PROGRESS NOTE   Ms. Ventresca was admitted with a syncopal episode.  I think she was probably dehydrated because of the severe problems with heat and humidity in the area.  She has improved.  Says she has been up and moving around.  She is not having any new complaints and no new difficulties.  Considering that I think she is probably ready for discharge now.  She is not showing any signs of any further episodes, so I am going to have her discharged home.  Please see discharge summary for details.     Gricelda Foland L. Juanetta Gosling, M.D.     ELH/MEDQ  D:  05/26/2011  T:  05/26/2011  Job:  [id number]

## 2011-05-26 NOTE — Plan of Care (Signed)
Problem: Discharge Progression Outcomes Goal: Other Discharge Outcomes/Goals Outcome: Completed/Met Date Met:  05/26/11 IV removed from left [facility] site benign cath tip intact discharge instructions read to pt and her son They both verbalized understanding of all instructions discharged to home with son

## 2011-05-28 NOTE — Discharge Summary (Signed)
NAMEHAZELINE, CHARNLEY                ACCOUNT NO.:  [id number]  MEDICAL RECORD NO.:  [id number]  LOCATION:  A331                          FACILITY:  [facility]  PHYSICIAN:  Sheketa Ende L. Juanetta Gosling, M.D.DATE OF BIRTH:  03/26/1941  DATE OF ADMISSION:  05/23/2011 DATE OF DISCHARGE:  07/09/2012LH                              DISCHARGE SUMMARY   FINAL DISCHARGE DIAGNOSES: 1. Syncope. 2. Probable heat exhaustion. 3. Tachycardia. 4. Status post cholecystectomy. 5. Status post pulmonary embolus. 6. Schizophrenia.  PHYSICAL EXAMINATION:  GENERAL:  Pleasant female. VITAL SIGNS:  Temperature was 97.7, blood pressure 111/56, pulse was 61, but then went up 100s, respirations about 28.  She appeared to be grossly intact. EXTREMITIES:  She had no edema.  Her peripheral pulses were diminished. ABDOMEN:  Soft.  EKG showed sinus bradycardia, inverted T-waves.  HOSPITAL COURSE:  She was started on IV fluids, had a monitor and it was felt that she had dehydration.  She had some problems with tachycardia and by the time of discharge, she was much improved and was discharged home on her regular medications with the addition of metoprolol 12.5 mg b.i.d.  She ruled out for myocardial infarction.  Her fluids were placed.  She was back to baseline at the time of discharge, able to ambulate in the halls without any shortness of breath, weakness or syncope.     Thresea Doble L. Juanetta Gosling, M.D.     ELH/MEDQ  D:  05/28/2011  T:  05/28/2011  Job:  [id number]

## 2011-06-12 ENCOUNTER — Encounter: Payer: Self-pay | Admitting: Internal Medicine

## 2011-06-30 ENCOUNTER — Ambulatory Visit: Payer: Self-pay | Admitting: Gastroenterology

## 2011-06-30 ENCOUNTER — Telehealth: Payer: Self-pay | Admitting: Gastroenterology

## 2011-07-01 NOTE — Telephone Encounter (Signed)
Please try to reschedule patient 

## 2011-07-15 ENCOUNTER — Encounter: Payer: Self-pay | Admitting: Urgent Care

## 2011-07-15 NOTE — Telephone Encounter (Signed)
Unable to reach patient and her son does not want to make OV for her. I made another OV for her to come in on 08/08/11 at 10 am with  KJ and mailed letter and appt card to patient.

## 2011-08-08 ENCOUNTER — Ambulatory Visit: Payer: Self-pay | Admitting: Urgent Care

## 2011-08-08 ENCOUNTER — Telehealth: Payer: Self-pay | Admitting: Urgent Care

## 2011-08-08 NOTE — Telephone Encounter (Signed)
No showed x2

## 2011-08-27 LAB — POCT I-STAT 4, (NA,K, GLUC, HGB,HCT)
HCT: 45
Hemoglobin: 15.3 — ABNORMAL HIGH
Sodium: 134 — ABNORMAL LOW

## 2011-10-01 ENCOUNTER — Encounter: Payer: Self-pay | Admitting: Gastroenterology

## 2011-10-01 ENCOUNTER — Ambulatory Visit (INDEPENDENT_AMBULATORY_CARE_PROVIDER_SITE_OTHER): Payer: PRIVATE HEALTH INSURANCE | Admitting: Gastroenterology

## 2011-10-01 ENCOUNTER — Telehealth: Payer: Self-pay | Admitting: Urgent Care

## 2011-10-01 ENCOUNTER — Ambulatory Visit: Payer: Self-pay | Admitting: Urgent Care

## 2011-10-01 VITALS — BP 124/79 | HR 77 | Temp 98.1°F | Ht 63.0 in | Wt 125.8 lb

## 2011-10-01 DIAGNOSIS — Z1211 Encounter for screening for malignant neoplasm of colon: Secondary | ICD-10-CM

## 2011-10-01 MED ORDER — PEG 3350-KCL-NA BICARB-NACL 420 G PO SOLR: ORAL | Status: AC

## 2011-10-01 NOTE — Telephone Encounter (Signed)
Pt was a no show

## 2011-10-01 NOTE — Progress Notes (Signed)
Primary Care Physician:  Dr. Juanetta Gosling Primary Gastroenterologist:  Dr. Jena Gauss   Chief Complaint  Patient presents with  . Colonoscopy    HPI:   [name] Long is a 70 year old female who presents today for screening colonoscopy; she denies any prior colonoscopies. She is somewhat of a poor historian; however, it appears both her mother and brother were diagnosed with colon cancer in the past, age unknown. She denies abdominal pain, nausea, vomiting. Reports loose stools if eating something "bad". Unable to each chocolate or milk products. Denies melena or hematochezia. Occasionally she has a decreased appetite. She is unsure if her weight is stable or not. She does have a history of schizophrenia; it does not appear she is on medications for this.    Past Medical History  Diagnosis Date  . PE (pulmonary embolism)   . Schizophrenia   . Biliary acute pancreatitis 2006    s/p ERCP with stent, sphincterotomy, stone extraction, subsequent stent removal  several weeks later  . Kidney anomaly, congenital     solitary left kidney    Past Surgical History  Procedure Date  . Cholecystectomy   . Umbilical hernia repair   . Abdominal hysterectomy     Current Outpatient Prescriptions  Medication Sig Dispense Refill  . ALPRAZolam (XANAX) 0.5 MG tablet Take 0.25 mg by mouth at bedtime as needed. Sleep/Nerves       . aspirin 81 MG chewable tablet Chew 1 tablet (81 mg total) by mouth daily.  30 tablet  12  . estrogens, conjugated, (PREMARIN) 0.625 MG tablet Take 0.625 mg by mouth daily. Take daily for 21 days then do not take for 7 days.       Marland Kitchen HYDROcodone-acetaminophen (VICODIN) 5-500 MG per tablet Take 1 tablet by mouth every 6 (six) hours as needed. Pain       . metoprolol tartrate (LOPRESSOR) 12.5 mg TABS Take 0.5 tablets (12.5 mg total) by mouth 2 (two) times daily.  30 tablet  12  . metoprolol tartrate (LOPRESSOR) 12.5 mg TABS Take 0.5 tablets (12.5 mg total) by mouth 2 (two) times daily.  30  tablet  12  . polyethylene glycol-electrolytes (TRILYTE) 420 G solution Use as directed Also buy 1 fleet enema & 4 dulcolax tablets to use as directed  4000 mL  0    Allergies as of 10/01/2011 - Review Complete 10/01/2011  Allergen Reaction Noted  . Penicillins Other (See Comments) 05/23/2011    Family History  Problem Relation Age of Onset  . Colon cancer      Mother, Brother, unsure age    History   Social History  . Marital Status: Single    Spouse Name: N/A    Number of Children: N/A  . Years of Education: N/A   Occupational History  . Not on file.   Social History Main Topics  . Smoking status: Never Smoker   . Smokeless tobacco: Not on file  . Alcohol Use: No  . Drug Use: No  . Sexually Active: Not on file   Other Topics Concern  . Not on file   Social History Narrative  . No narrative on file    Review of Systems: Gen: Denies any fever, chills, fatigue, weight loss, lack of appetite.  CV: Denies chest pain, heart palpitations, peripheral edema, syncope.  Resp: Denies shortness of breath at rest or with exertion. Denies wheezing or cough.  GI: SEE HPI GU : Denies urinary burning, urinary frequency, urinary hesitancy MS: Denies joint pain, muscle  weakness, cramps, or limitation of movement.  Derm: Denies rash, itching, dry skin Psych: Denies depression, anxiety, memory loss, and confusion Heme: Denies bruising, bleeding, and enlarged lymph nodes.  Physical Exam: BP 124/79  Pulse 77  Temp(Src) 98.1 F (36.7 C) (Temporal)  Ht 5\' 3"  (1.6 m)  Wt 125 lb 12.8 oz (57.063 kg)  BMI 22.28 kg/m2 General:   Alert and oriented. Pleasant and cooperative. Well-nourished and well-developed.  Head:  Normocephalic and atraumatic. Eyes:  Without icterus, sclera clear and conjunctiva pink.  Ears:  Normal auditory acuity. Nose:  No deformity, discharge,  or lesions. Mouth:  No deformity or lesions, oral mucosa pink.  Neck:  Supple, without mass or  thyromegaly. Lungs:  Clear to auscultation bilaterally. No wheezes, rales, or rhonchi. No distress.  Heart:  S1, S2 present without murmurs appreciated.  Abdomen:  +BS, soft, non-tender and non-distended. No HSM noted. No guarding or rebound. Possible umbilical hernia noted.  Rectal:  Deferred  Msk:  Symmetrical without gross deformities. Normal posture. Extremities:  Without clubbing or edema. Neurologic:  Alert and  oriented x4;  grossly normal neurologically. Skin:  Intact without significant lesions or rashes. Cervical Nodes:  No significant cervical adenopathy. Psych:  Alert and cooperative. Normal mood and affect.

## 2011-10-01 NOTE — Telephone Encounter (Signed)
Patient was rescheduled and seen by Florene Glen, NP

## 2011-10-01 NOTE — Patient Instructions (Signed)
We have set you up for a colonoscopy with Dr. Jena Gauss.  Start following a high fiber diet, and avoid those foods that have milk products in them.   Further recommendations to follow.

## 2011-10-02 ENCOUNTER — Encounter: Payer: Self-pay | Admitting: Gastroenterology

## 2011-10-02 DIAGNOSIS — Z1211 Encounter for screening for malignant neoplasm of colon: Secondary | ICD-10-CM | POA: Insufficient documentation

## 2011-10-02 NOTE — Assessment & Plan Note (Signed)
70 year old female with no prior colonoscopy, as well as FH of colon cancer in 2 first degree relatives (mom and brother). She is somewhat of a poor historian, so it is unclear if it was truly colon cancer or not; she is also unsure age of onset. Denies hematochezia, abdominal pain, change in bowel habits. She does not a trend towards loose stools if eating something "bad". We will proceed with a high risk screening colonoscopy in near future.   Proceed with TCS with Dr. Jena Gauss in near future: the risks, benefits, and alternatives have been discussed with the patient in detail. The patient states understanding and desires to proceed.

## 2011-10-06 NOTE — Progress Notes (Signed)
Cc to PCP 

## 2011-10-28 MED ORDER — SODIUM CHLORIDE 0.45 % IV SOLN: Freq: Once | INTRAVENOUS | Status: DC

## 2011-10-29 ENCOUNTER — Ambulatory Visit (HOSPITAL_COMMUNITY)
Admission: RE | Admit: 2011-10-29 | Payer: PRIVATE HEALTH INSURANCE | Source: Ambulatory Visit | Admitting: Internal Medicine

## 2011-10-29 ENCOUNTER — Encounter (HOSPITAL_COMMUNITY): Admission: RE | Payer: Self-pay | Source: Ambulatory Visit

## 2011-10-29 SURGERY — COLONOSCOPY
Anesthesia: Moderate Sedation

## 2012-02-02 ENCOUNTER — Other Ambulatory Visit (HOSPITAL_COMMUNITY): Payer: Self-pay | Admitting: Pulmonary Disease

## 2012-02-02 DIAGNOSIS — I739 Peripheral vascular disease, unspecified: Secondary | ICD-10-CM

## 2012-02-03 ENCOUNTER — Other Ambulatory Visit (HOSPITAL_COMMUNITY): Payer: Self-pay | Admitting: Pulmonary Disease

## 2012-02-03 ENCOUNTER — Ambulatory Visit (HOSPITAL_COMMUNITY)
Admission: RE | Admit: 2012-02-03 | Discharge: 2012-02-03 | Disposition: A | Discharge status: Home / Self Care | Payer: PRIVATE HEALTH INSURANCE | Source: Ambulatory Visit | Attending: Pulmonary Disease | Admitting: Pulmonary Disease

## 2012-02-03 DIAGNOSIS — M79609 Pain in unspecified limb: Secondary | ICD-10-CM | POA: Insufficient documentation

## 2012-02-03 DIAGNOSIS — I10 Essential (primary) hypertension: Secondary | ICD-10-CM | POA: Insufficient documentation

## 2012-02-03 DIAGNOSIS — I739 Peripheral vascular disease, unspecified: Secondary | ICD-10-CM

## 2012-02-03 DIAGNOSIS — R0602 Shortness of breath: Secondary | ICD-10-CM

## 2012-03-03 ENCOUNTER — Emergency Department (HOSPITAL_COMMUNITY): Payer: PRIVATE HEALTH INSURANCE

## 2012-03-03 ENCOUNTER — Emergency Department (HOSPITAL_COMMUNITY)
Admission: EM | Admit: 2012-03-03 | Discharge: 2012-03-03 | Disposition: A | Discharge status: Home / Self Care | Payer: PRIVATE HEALTH INSURANCE | Attending: Emergency Medicine | Admitting: Emergency Medicine

## 2012-03-03 ENCOUNTER — Encounter (HOSPITAL_COMMUNITY): Payer: Self-pay | Admitting: *Deleted

## 2012-03-03 DIAGNOSIS — F209 Schizophrenia, unspecified: Secondary | ICD-10-CM | POA: Insufficient documentation

## 2012-03-03 DIAGNOSIS — I517 Cardiomegaly: Secondary | ICD-10-CM | POA: Insufficient documentation

## 2012-03-03 DIAGNOSIS — R110 Nausea: Secondary | ICD-10-CM

## 2012-03-03 DIAGNOSIS — Z86711 Personal history of pulmonary embolism: Secondary | ICD-10-CM | POA: Insufficient documentation

## 2012-03-03 DIAGNOSIS — N951 Menopausal and female climacteric states: Secondary | ICD-10-CM | POA: Insufficient documentation

## 2012-03-03 DIAGNOSIS — Q638 Other specified congenital malformations of kidney: Secondary | ICD-10-CM | POA: Insufficient documentation

## 2012-03-03 DIAGNOSIS — Z7982 Long term (current) use of aspirin: Secondary | ICD-10-CM | POA: Insufficient documentation

## 2012-03-03 DIAGNOSIS — R6883 Chills (without fever): Secondary | ICD-10-CM | POA: Insufficient documentation

## 2012-03-03 DIAGNOSIS — R42 Dizziness and giddiness: Secondary | ICD-10-CM | POA: Insufficient documentation

## 2012-03-03 LAB — CBC
HCT: 38.3 % (ref 36.0–46.0)
MCHC: 33.2 g/dL (ref 30.0–36.0)
RDW: 12.2 % (ref 11.5–15.5)

## 2012-03-03 LAB — URINALYSIS, ROUTINE W REFLEX MICROSCOPIC
Glucose, UA: NEGATIVE mg/dL
Leukocytes, UA: NEGATIVE
Protein, ur: NEGATIVE mg/dL
pH: 7 (ref 5.0–8.0)

## 2012-03-03 LAB — URINE MICROSCOPIC-ADD ON

## 2012-03-03 LAB — BASIC METABOLIC PANEL
CO2: 28 mEq/L (ref 19–32)
Calcium: 9.8 mg/dL (ref 8.4–10.5)
Glucose, Bld: 129 mg/dL — ABNORMAL HIGH (ref 70–99)
Potassium: 5 mEq/L (ref 3.5–5.1)

## 2012-03-03 NOTE — ED Notes (Signed)
Family came to nursing station states pt is ready to go. Advised waiting on discharge paperwork.

## 2012-03-03 NOTE — Discharge Instructions (Signed)
Your blood work was normal here today. Try to eat three meals a day or at least snack throughout the day. Get plenty of rest.Drink lots of fluids. If you have continued episodes of chills and feeling dizzy, return to the emergency room or followup with your primary care doctor.

## 2012-03-03 NOTE — ED Notes (Signed)
Reports hot flashes and cold chills with dizziness and nausea that started last night.  Denies vomiting, denies any pain.

## 2012-03-03 NOTE — ED Provider Notes (Signed)
History     CSN: [id number]  Arrival date & time 03/03/12  1415   First MD Initiated Contact with Patient 03/03/12 1655      Chief Complaint  Patient presents with  . Dizziness  . Nausea    (Consider location/radiation/quality/duration/timing/severity/associated sxs/prior treatment) HPI [name] Long is a 71 y.o. female who presents to the Emergency Department complaining of nausea, dizziness, feeling hot and cold that began last night. She denies fever, vomiting, diarrhea, shortness of breath, cough, difficulty speaking or swallowing, or focal muscle weakness. She has taken no medicines.    Past Medical History  Diagnosis Date  . PE (pulmonary embolism)   . Schizophrenia   . Biliary acute pancreatitis 2006    s/p ERCP with stent, sphincterotomy, stone extraction, subsequent stent removal  several weeks later  . Kidney anomaly, congenital     solitary left kidney    Past Surgical History  Procedure Date  . Cholecystectomy   . Umbilical hernia repair   . Abdominal hysterectomy     Family History  Problem Relation Age of Onset  . Colon cancer      Mother, Brother, unsure age    History  Substance Use Topics  . Smoking status: Never Smoker   . Smokeless tobacco: Not on file  . Alcohol Use: No    OB History    Grav Para Term Preterm Abortions TAB SAB Ect Mult Living                  Review of Systems  Constitutional: Positive for chills. Negative for fever.       10 Systems reviewed and are negative for acute change except as noted in the HPI.  HENT: Negative for congestion.   Eyes: Negative for discharge and redness.  Respiratory: Negative for cough and shortness of breath.   Cardiovascular: Negative for chest pain.  Gastrointestinal: Positive for nausea. Negative for vomiting, abdominal pain and diarrhea.  Musculoskeletal: Negative for back pain.  Skin: Negative for rash.  Neurological: Negative for syncope, numbness and headaches.    Psychiatric/Behavioral:       No behavior change.    Allergies  Penicillins  Home Medications   Current Outpatient Rx  Name Route Sig Dispense Refill  . ALPRAZOLAM 0.5 MG PO TABS Oral Take 0.25 mg by mouth at bedtime as needed. Sleep/Nerves     . ASPIRIN 81 MG PO CHEW Oral Chew 1 tablet (81 mg total) by mouth daily. 30 tablet 12  . ESTROGENS CONJUGATED 0.625 MG PO TABS Oral Take 0.625 mg by mouth daily. Take daily for 21 days then do not take for 7 days.     Marland Kitchen HYDROCODONE-ACETAMINOPHEN 5-500 MG PO TABS Oral Take 1 tablet by mouth every 6 (six) hours as needed. Pain     . METOPROLOL TARTRATE 12.5 MG HALF TABLET Oral Take 0.5 tablets (12.5 mg total) by mouth 2 (two) times daily. 30 tablet 12  . METOPROLOL TARTRATE 12.5 MG HALF TABLET Oral Take 0.5 tablets (12.5 mg total) by mouth 2 (two) times daily. 30 tablet 12    BP 129/66  Pulse 65  Temp(Src) 97.9 F (36.6 C) (Oral)  Resp 22  Ht 5\' 3"  (1.6 m)  Wt 130 lb (58.968 kg)  BMI 23.03 kg/m2  SpO2 100%  Physical Exam Physical examination:  Nursing notes reviewed; Vital signs and O2 SAT reviewed;  Constitutional: Well developed, Well nourished, Well hydrated, In no acute distress; Head:  Normocephalic, atraumatic; Eyes: EOMI, PERRL,  No scleral icterus; ENMT: Mouth and pharynx normal, Mucous membranes moist; Neck: Supple, Full range of motion, No lymphadenopathy; Cardiovascular: Regular rate and rhythm, No murmur, rub, or gallop; Respiratory: Breath sounds clear & equal bilaterally, No rales, rhonchi, wheezes, or rub, Normal respiratory effort/excursion; Chest: Nontender, Movement normal; Abdomen: Soft, Nontender, Nondistended, Normal bowel sounds; Genitourinary: No CVA tenderness; Extremities: Pulses normal, No tenderness, No edema, No calf edema or asymmetry.; Neuro: AA&Ox3, Major CN grossly intact.  No gross focal motor or sensory deficits in extremities.; Skin: Color normal, Warm, Dry  ED Course  Procedures (including critical care  time)  Results for orders placed during the hospital encounter of 05/23/11  DIFFERENTIAL      Component Value Range   Neutrophils Relative 60  43 - 77 (%)   Neutro Abs 3.5  1.7 - 7.7 (K/uL)   Lymphocytes Relative 31  12 - 46 (%)   Lymphs Abs 1.8  0.7 - 4.0 (K/uL)   Monocytes Relative 9  3 - 12 (%)   Monocytes Absolute 0.5  0.1 - 1.0 (K/uL)   Eosinophils Relative 0  0 - 5 (%)   Eosinophils Absolute 0.0  0.0 - 0.7 (K/uL)   Basophils Relative 0  0 - 1 (%)   Basophils Absolute 0.0  0.0 - 0.1 (K/uL)  CBC      Component Value Range   WBC 5.8  4.0 - 10.5 (K/uL)   RBC 4.24  3.87 - 5.11 (MIL/uL)   Hemoglobin 13.1  12.0 - 15.0 (g/dL)   HCT 84.6  96.2 - 95.2 (%)   MCV 93.6  78.0 - 100.0 (fL)   MCH 30.9  26.0 - 34.0 (pg)   MCHC 33.0  30.0 - 36.0 (g/dL)   RDW 84.1  32.4 - 40.1 (%)   Platelets 147 (*) 150 - 400 (K/uL)  CK TOTAL AND CKMB      Component Value Range   Total CK 323 (*) 7 - 177 (U/L)   CK, MB 11.1 (*) 0.3 - 4.0 (ng/mL)   Relative Index 3.4 (*) 0.0 - 2.5   TROPONIN I      Component Value Range   Troponin I <0.30  <0.30 (ng/mL)  BASIC METABOLIC PANEL      Component Value Range   Sodium 138  135 - 145 (mEq/L)   Potassium 4.4  3.5 - 5.1 (mEq/L)   Chloride 102  96 - 112 (mEq/L)   CO2 28  19 - 32 (mEq/L)   Glucose, Bld 89  70 - 99 (mg/dL)   BUN 11  6 - 23 (mg/dL)   Creatinine, Ser 0.27  0.50 - 1.10 (mg/dL)   Calcium 9.1  8.4 - 25.3 (mg/dL)   GFR calc non Af Amer >60  >60 (mL/min)   GFR calc Af Amer >60  >60 (mL/min)  CK TOTAL AND CKMB      Component Value Range   Total CK 246 (*) 7 - 177 (U/L)   CK, MB 8.0 (*) 0.3 - 4.0 (ng/mL)   Relative Index 3.3 (*) 0.0 - 2.5   TROPONIN I      Component Value Range   Troponin I <0.30  <0.30 (ng/mL)   No results found. MDM  Patient with symptoms of chills and nausea that began last night. Given by mouth fluids, has had a p.m. snack. Labs are unremarkable.Pt stable in ED with no significant deterioration in condition.The patient  appears reasonably screened and/or stabilized for discharge and I doubt any other medical  condition or other [facility] requiring further screening, evaluation, or treatment in the ED at this time prior to discharge.  MDM Reviewed: nursing note and vitals Interpretation: labs           Nicoletta Dress. Colon Branch, MD 03/03/12 1478

## 2012-04-29 ENCOUNTER — Encounter (HOSPITAL_COMMUNITY): Payer: Self-pay | Admitting: *Deleted

## 2012-04-29 ENCOUNTER — Emergency Department (HOSPITAL_COMMUNITY)
Admission: EM | Admit: 2012-04-29 | Discharge: 2012-04-29 | Disposition: A | Discharge status: Home / Self Care | Payer: PRIVATE HEALTH INSURANCE | Attending: Emergency Medicine | Admitting: Emergency Medicine

## 2012-04-29 DIAGNOSIS — R112 Nausea with vomiting, unspecified: Secondary | ICD-10-CM | POA: Insufficient documentation

## 2012-04-29 DIAGNOSIS — N390 Urinary tract infection, site not specified: Secondary | ICD-10-CM

## 2012-04-29 DIAGNOSIS — Z79899 Other long term (current) drug therapy: Secondary | ICD-10-CM | POA: Insufficient documentation

## 2012-04-29 DIAGNOSIS — Q605 Renal hypoplasia, unspecified: Secondary | ICD-10-CM | POA: Insufficient documentation

## 2012-04-29 DIAGNOSIS — Q602 Renal agenesis, unspecified: Secondary | ICD-10-CM | POA: Insufficient documentation

## 2012-04-29 DIAGNOSIS — F209 Schizophrenia, unspecified: Secondary | ICD-10-CM | POA: Insufficient documentation

## 2012-04-29 DIAGNOSIS — R1110 Vomiting, unspecified: Secondary | ICD-10-CM

## 2012-04-29 DIAGNOSIS — R51 Headache: Secondary | ICD-10-CM | POA: Insufficient documentation

## 2012-04-29 DIAGNOSIS — Z7982 Long term (current) use of aspirin: Secondary | ICD-10-CM | POA: Insufficient documentation

## 2012-04-29 DIAGNOSIS — Z86711 Personal history of pulmonary embolism: Secondary | ICD-10-CM | POA: Insufficient documentation

## 2012-04-29 DIAGNOSIS — R5381 Other malaise: Secondary | ICD-10-CM | POA: Insufficient documentation

## 2012-04-29 LAB — URINALYSIS, ROUTINE W REFLEX MICROSCOPIC
Glucose, UA: NEGATIVE mg/dL
Ketones, ur: 15 mg/dL — AB
pH: 6.5 (ref 5.0–8.0)

## 2012-04-29 LAB — COMPREHENSIVE METABOLIC PANEL
ALT: 12 U/L (ref 0–35)
AST: 20 U/L (ref 0–37)
Albumin: 3.3 g/dL — ABNORMAL LOW (ref 3.5–5.2)
CO2: 25 mEq/L (ref 19–32)
Chloride: 100 mEq/L (ref 96–112)
Creatinine, Ser: 0.73 mg/dL (ref 0.50–1.10)
Sodium: 136 mEq/L (ref 135–145)
Total Bilirubin: 1 mg/dL (ref 0.3–1.2)

## 2012-04-29 LAB — CBC
HCT: 38.4 % (ref 36.0–46.0)
MCHC: 33.9 g/dL (ref 30.0–36.0)
Platelets: 167 10*3/uL (ref 150–400)
RDW: 11.9 % (ref 11.5–15.5)
WBC: 5.6 10*3/uL (ref 4.0–10.5)

## 2012-04-29 LAB — URINE MICROSCOPIC-ADD ON

## 2012-04-29 LAB — DIFFERENTIAL
Basophils Absolute: 0 10*3/uL (ref 0.0–0.1)
Basophils Relative: 0 % (ref 0–1)
Lymphocytes Relative: 30 % (ref 12–46)
Monocytes Absolute: 0.6 10*3/uL (ref 0.1–1.0)
Neutro Abs: 3.4 10*3/uL (ref 1.7–7.7)
Neutrophils Relative %: 60 % (ref 43–77)

## 2012-04-29 MED ORDER — SODIUM CHLORIDE 0.9 % IV BOLUS (SEPSIS)
1000.0000 mL | Freq: Once | INTRAVENOUS | Status: AC
  Administered 2012-04-29: 1000 mL via INTRAVENOUS

## 2012-04-29 MED ORDER — ONDANSETRON 4 MG PO TBDP: ORAL_TABLET | ORAL | Status: DC

## 2012-04-29 MED ORDER — CIPROFLOXACIN HCL 500 MG PO TABS: 500.0000 mg | ORAL_TABLET | Freq: Two times a day (BID) | ORAL | Status: AC

## 2012-04-29 MED ORDER — ONDANSETRON HCL 4 MG/2ML IJ SOLN
4.0000 mg | Freq: Once | INTRAMUSCULAR | Status: AC
  Administered 2012-04-29: 4 mg via INTRAVENOUS
  Filled 2012-04-29: qty 2

## 2012-04-29 NOTE — ED Provider Notes (Signed)
History   This chart was scribed for Benny Lennert, MD by Clarita Crane. The patient was seen in room APA10/APA10. Patient's care was started at 0847.    CSN: [id number]  Arrival date & time 04/29/12  0847   First MD Initiated Contact with Patient 04/29/12 0913      Chief Complaint  Patient presents with  . Emesis    (Consider location/radiation/quality/duration/timing/severity/associated sxs/prior treatment) HPI [name] Long is a 71 y.o. female who presents to the Emergency Department complaining of moderate to severe intermittent nausea and vomiting with associated decreased appetite, chills, HA and generalized weakness onset 4 days ago and persistent since. Notes weakness aggravated with standing. Denies cough, chest pain, SOB, abdominal pain. Patient with h/o PE, schizophrenia, pancreatitis, kidney stones, abdominal hysterectomy, umbilical hernia.  Past Medical History  Diagnosis Date  . PE (pulmonary embolism)   . Schizophrenia   . Biliary acute pancreatitis 2006    s/p ERCP with stent, sphincterotomy, stone extraction, subsequent stent removal  several weeks later  . Kidney anomaly, congenital     solitary left kidney    Past Surgical History  Procedure Date  . Cholecystectomy   . Umbilical hernia repair   . Abdominal hysterectomy     Family History  Problem Relation Age of Onset  . Colon cancer      Mother, Brother, unsure age    History  Substance Use Topics  . Smoking status: Never Smoker   . Smokeless tobacco: Not on file  . Alcohol Use: No    OB History    Grav Para Term Preterm Abortions TAB SAB Ect Mult Living                  Review of Systems  Constitutional: Positive for chills and appetite change. Negative for fatigue.  HENT: Negative for congestion, sinus pressure and ear discharge.   Eyes: Negative for discharge.  Respiratory: Negative for cough.   Cardiovascular: Negative for chest pain.  Gastrointestinal: Positive for nausea and  vomiting. Negative for abdominal pain and diarrhea.  Genitourinary: Negative for frequency and hematuria.  Musculoskeletal: Negative for back pain.  Skin: Negative for rash.  Neurological: Positive for weakness and headaches. Negative for seizures.  Hematological: Negative.   Psychiatric/Behavioral: Negative for hallucinations.    Allergies  Penicillins  Home Medications   Current Outpatient Rx  Name Route Sig Dispense Refill  . ALPRAZOLAM 0.5 MG PO TABS Oral Take 0.25 mg by mouth at bedtime as needed. Sleep/Nerves     . ASPIRIN 81 MG PO CHEW Oral Chew 1 tablet (81 mg total) by mouth daily. 30 tablet 12  . ESTROGENS CONJUGATED 0.625 MG PO TABS Oral Take 0.625 mg by mouth daily.     Marland Kitchen HYDROCODONE-ACETAMINOPHEN 5-500 MG PO TABS Oral Take 1 tablet by mouth every 6 (six) hours as needed. Pain     . METOPROLOL TARTRATE 25 MG PO TABS Oral Take 25 mg by mouth 2 (two) times daily.      BP 101/64  Pulse 97  Temp 97.6 F (36.4 C) (Oral)  Resp 24  Ht 5\' 4"  (1.626 m)  Wt 120 lb (54.432 kg)  BMI 20.60 kg/m2  SpO2 100%  Physical Exam  Nursing note and vitals reviewed. Constitutional: She is oriented to person, place, and time. She appears well-developed and well-nourished. No distress.  HENT:  Head: Normocephalic and atraumatic.  Mouth/Throat: Oropharynx is clear and moist.  Eyes: EOM are normal. Pupils are equal, round, and reactive  to light.  Neck: Neck supple. No tracheal deviation present.  Cardiovascular: Normal rate and regular rhythm.  Exam reveals no gallop and no friction rub.   No murmur heard. Pulmonary/Chest: Effort normal. No respiratory distress. She has no wheezes. She has no rales.  Abdominal: Soft. She exhibits no distension. There is no tenderness.  Musculoskeletal: Normal range of motion. She exhibits no edema.  Neurological: She is alert and oriented to person, place, and time. No sensory deficit.  Skin: Skin is warm and dry.  Psychiatric: She has a normal mood  and affect. Her behavior is normal.    ED Course  Procedures (including critical care time)  DIAGNOSTIC STUDIES: Oxygen Saturation is 100% on room air, normal by my interpretation.    COORDINATION OF CARE: 9:18AM-Patient informed of current plan for treatment and evaluation and agrees with plan at this time.  10:49AM- Patient informed of lab results. States she is feeling improved at this time. Will check UA. 11:54AM- Patient informed of results of UA and plan for treatment. Patient to be d/c home.   Labs Reviewed  COMPREHENSIVE METABOLIC PANEL - Abnormal; Notable for the following:    Albumin 3.3 (*)     GFR calc non Af Amer 83 (*)     All other components within normal limits  CBC  DIFFERENTIAL  LIPASE, BLOOD   No results found.   No diagnosis found.    MDM        The chart was scribed for me under my direct supervision.  I personally performed the history, physical, and medical decision making and all procedures in the evaluation of this patient.Benny Lennert, MD 04/29/12 1158

## 2012-04-29 NOTE — Discharge Instructions (Signed)
Follow up with your md next week. °

## 2012-04-29 NOTE — ED Notes (Signed)
Pt verbalized understanding of d/c instructions, pt to wait in waiting room for ride home

## 2012-04-29 NOTE — ED Notes (Signed)
Pt states she feels a little bit better

## 2012-04-29 NOTE — ED Notes (Signed)
Pt c/o vomiting and diarrhea since Sunday. C/o weakness and dizziness. Unable to keep anything down per patient.

## 2012-04-29 NOTE — ED Notes (Signed)
Pt states that she feels better now  

## 2012-04-30 LAB — URINE CULTURE

## 2014-03-13 ENCOUNTER — Encounter (HOSPITAL_COMMUNITY): Payer: Self-pay | Admitting: Emergency Medicine

## 2014-03-13 ENCOUNTER — Emergency Department (HOSPITAL_COMMUNITY)
Admission: EM | Admit: 2014-03-13 | Discharge: 2014-03-13 | Disposition: A | Discharge status: Home / Self Care | Payer: PRIVATE HEALTH INSURANCE | Attending: Emergency Medicine | Admitting: Emergency Medicine

## 2014-03-13 ENCOUNTER — Emergency Department (HOSPITAL_COMMUNITY): Payer: PRIVATE HEALTH INSURANCE

## 2014-03-13 DIAGNOSIS — Q602 Renal agenesis, unspecified: Secondary | ICD-10-CM | POA: Insufficient documentation

## 2014-03-13 DIAGNOSIS — R42 Dizziness and giddiness: Secondary | ICD-10-CM | POA: Insufficient documentation

## 2014-03-13 DIAGNOSIS — Z86711 Personal history of pulmonary embolism: Secondary | ICD-10-CM | POA: Insufficient documentation

## 2014-03-13 DIAGNOSIS — Q605 Renal hypoplasia, unspecified: Secondary | ICD-10-CM

## 2014-03-13 DIAGNOSIS — R55 Syncope and collapse: Secondary | ICD-10-CM

## 2014-03-13 DIAGNOSIS — IMO0002 Reserved for concepts with insufficient information to code with codable children: Secondary | ICD-10-CM | POA: Insufficient documentation

## 2014-03-13 DIAGNOSIS — Z79899 Other long term (current) drug therapy: Secondary | ICD-10-CM | POA: Insufficient documentation

## 2014-03-13 DIAGNOSIS — Z8719 Personal history of other diseases of the digestive system: Secondary | ICD-10-CM | POA: Insufficient documentation

## 2014-03-13 DIAGNOSIS — F209 Schizophrenia, unspecified: Secondary | ICD-10-CM | POA: Insufficient documentation

## 2014-03-13 DIAGNOSIS — R5383 Other fatigue: Secondary | ICD-10-CM

## 2014-03-13 DIAGNOSIS — R5381 Other malaise: Secondary | ICD-10-CM | POA: Insufficient documentation

## 2014-03-13 DIAGNOSIS — Z7982 Long term (current) use of aspirin: Secondary | ICD-10-CM | POA: Insufficient documentation

## 2014-03-13 DIAGNOSIS — E860 Dehydration: Secondary | ICD-10-CM | POA: Insufficient documentation

## 2014-03-13 DIAGNOSIS — N390 Urinary tract infection, site not specified: Secondary | ICD-10-CM | POA: Insufficient documentation

## 2014-03-13 LAB — BASIC METABOLIC PANEL
BUN: 10 mg/dL (ref 6–23)
CALCIUM: 9.6 mg/dL (ref 8.4–10.5)
CHLORIDE: 99 meq/L (ref 96–112)
CO2: 29 meq/L (ref 19–32)
Creatinine, Ser: 0.87 mg/dL (ref 0.50–1.10)
GFR calc Af Amer: 74 mL/min — ABNORMAL LOW (ref >90)
GFR calc non Af Amer: 64 mL/min — ABNORMAL LOW (ref >90)
GLUCOSE: 108 mg/dL — AB (ref 70–99)
POTASSIUM: 3.9 meq/L (ref 3.7–5.3)
Sodium: 141 mEq/L (ref 137–147)

## 2014-03-13 LAB — URINALYSIS, ROUTINE W REFLEX MICROSCOPIC
Bilirubin Urine: NEGATIVE
GLUCOSE, UA: NEGATIVE mg/dL
KETONES UR: NEGATIVE mg/dL
Nitrite: NEGATIVE
PH: 6.5 (ref 5.0–8.0)
Protein, ur: NEGATIVE mg/dL
Specific Gravity, Urine: 1.01 (ref 1.005–1.030)
Urobilinogen, UA: 4 mg/dL — ABNORMAL HIGH (ref 0.0–1.0)

## 2014-03-13 LAB — URINE MICROSCOPIC-ADD ON

## 2014-03-13 LAB — CBC WITH DIFFERENTIAL/PLATELET
Basophils Absolute: 0 10*3/uL (ref 0.0–0.1)
Basophils Relative: 0 % (ref 0–1)
Eosinophils Absolute: 0 10*3/uL (ref 0.0–0.7)
Eosinophils Relative: 0 % (ref 0–5)
HEMATOCRIT: 41.1 % (ref 36.0–46.0)
Hemoglobin: 13.8 g/dL (ref 12.0–15.0)
LYMPHS ABS: 2.3 10*3/uL (ref 0.7–4.0)
LYMPHS PCT: 44 % (ref 12–46)
MCH: 30.1 pg (ref 26.0–34.0)
MCHC: 33.6 g/dL (ref 30.0–36.0)
MCV: 89.7 fL (ref 78.0–100.0)
Monocytes Absolute: 0.6 10*3/uL (ref 0.1–1.0)
Monocytes Relative: 11 % (ref 3–12)
Neutro Abs: 2.3 10*3/uL (ref 1.7–7.7)
Neutrophils Relative %: 44 % (ref 43–77)
PLATELETS: 247 10*3/uL (ref 150–400)
RBC: 4.58 MIL/uL (ref 3.87–5.11)
RDW: 12.2 % (ref 11.5–15.5)
WBC: 5.2 10*3/uL (ref 4.0–10.5)

## 2014-03-13 MED ORDER — CIPROFLOXACIN HCL 250 MG PO TABS
500.0000 mg | ORAL_TABLET | Freq: Once | ORAL | Status: AC
  Administered 2014-03-13: 500 mg via ORAL
  Filled 2014-03-13: qty 2

## 2014-03-13 MED ORDER — SODIUM CHLORIDE 0.9 % IV BOLUS (SEPSIS)
1000.0000 mL | Freq: Once | INTRAVENOUS | Status: AC
  Administered 2014-03-13: 1000 mL via INTRAVENOUS

## 2014-03-13 MED ORDER — CIPROFLOXACIN HCL 500 MG PO TABS: 500.0000 mg | ORAL_TABLET | Freq: Two times a day (BID) | ORAL | Status: DC

## 2014-03-13 NOTE — ED Notes (Signed)
Pt became dizzy when standing for orthostatic VS. Pt also had drop in BP and increased HR when changing from lying to sitting position.

## 2014-03-13 NOTE — Discharge Instructions (Signed)
You have a urinary infection.  Increase fluids.  Antibiotic for one week.   Follow up your dr

## 2014-03-13 NOTE — ED Notes (Signed)
Pt comes via EMS after a reported syncopal episode. Pt states she was walking home when she became dizzy and "blacked out." Pt denies hitting head, denies LOC. Pt also denies any pain at this time. Pt states "I still feel a little dizzy now."

## 2014-03-13 NOTE — ED Provider Notes (Signed)
CSN: [id number]     Arrival date & time 03/13/14  1225 History  This chart was scribed for Donnetta Hutching, MD by Tana Conch, ED Scribe. This patient was seen in room APA02/APA02 and the patient's care was started at 1:21 PM.    Chief Complaint  Patient presents with  . Loss of Consciousness      Patient is a 73 y.o. female presenting with syncope. The history is provided by the patient. No language interpreter was used.  Loss of Consciousness Associated symptoms: dizziness and weakness   Associated symptoms: no fever     HPI Comments: [name] Long is a 73 y.o. female who presents to the Emergency Department complaining of syncopal episode while leaving [place], she got dizzy after leaving [place] and "could not go any further". She also reports that she has had a similar episode in the past. Pt denies any pain, SOB, LOC, hitting her head, and fever. She reports having really dry lips and is "really thirsty". She states that she is still a "little dizzy", but feels a lot better.      Past Medical History  Diagnosis Date  . PE (pulmonary embolism)   . Schizophrenia   . Biliary acute pancreatitis 2006    s/p ERCP with stent, sphincterotomy, stone extraction, subsequent stent removal  several weeks later  . Kidney anomaly, congenital     solitary left kidney   Past Surgical History  Procedure Laterality Date  . Cholecystectomy    . Umbilical hernia repair    . Abdominal hysterectomy     Family History  Problem Relation Age of Onset  . Colon cancer      Mother, Brother, unsure age   History  Substance Use Topics  . Smoking status: Never Smoker   . Smokeless tobacco: Not on file  . Alcohol Use: No   OB History   Grav Para Term Preterm Abortions TAB SAB Ect Mult Living                 Review of Systems  Constitutional: Negative for fever and chills.  Cardiovascular: Positive for syncope.  Neurological: Positive for dizziness, syncope and weakness.  All other  systems reviewed and are negative.  A complete 10 system review of systems was obtained and all systems are negative except as noted in the HPI and PMH.     Allergies  Review of patient's allergies indicates no known allergies.  Home Medications   Prior to Admission medications   Medication Sig Start Date End Date Taking? Authorizing Provider  aspirin EC 81 MG tablet Take 81 mg by mouth daily.   Yes Historical Provider, MD  estrogens, conjugated, (PREMARIN) 0.625 MG tablet Take 0.625 mg by mouth daily.    Yes Historical Provider, MD  metoprolol tartrate (LOPRESSOR) 25 MG tablet Take 12.5 mg by mouth 2 (two) times daily.    Yes Historical Provider, MD  risperiDONE (RISPERDAL) 0.5 MG tablet Take 0.5 mg by mouth 2 (two) times daily. 03/04/14  Yes Historical Provider, MD  ALPRAZolam Prudy Feeler) 0.5 MG tablet Take 0.25 mg by mouth at bedtime as needed. Sleep/Nerves     Historical Provider, MD   BP 97/68  Pulse 67  Temp(Src) 98.1 F (36.7 C) (Oral)  Resp 18  Wt 120 lb (54.432 kg)  SpO2 100% Physical Exam  Nursing note and vitals reviewed. Constitutional: She is oriented to person, place, and time. She appears well-developed and well-nourished.  Dehrydated, crusted lips and dry  mouth  HENT:  Head: Normocephalic and atraumatic.  Eyes: Conjunctivae and EOM are normal. Pupils are equal, round, and reactive to light.  Neck: Normal range of motion. Neck supple.  Cardiovascular: Normal rate, regular rhythm and normal heart sounds.   Pulmonary/Chest: Effort normal and breath sounds normal.  Abdominal: Soft. Bowel sounds are normal.  Musculoskeletal: Normal range of motion.  Neurological: She is alert and oriented to person, place, and time.  Skin: Skin is warm and dry.  Psychiatric: She has a normal mood and affect. Her behavior is normal.    ED Course  Procedures (including critical care time)  DIAGNOSTIC STUDIES: Oxygen Saturation is 100% on RA, normal by my interpretation.     COORDINATION OF CARE:   1:25 PM-Discussed treatment plan which includes EKG, labs  with pt at bedside and pt agreed to plan.   Labs Review Labs Reviewed  BASIC METABOLIC PANEL - Abnormal; Notable for the following:    Glucose, Bld 108 (*)    GFR calc non Af Amer 64 (*)    GFR calc Af Amer 74 (*)    All other components within normal limits  URINALYSIS, ROUTINE W REFLEX MICROSCOPIC - Abnormal; Notable for the following:    Hgb urine dipstick SMALL (*)    Urobilinogen, UA 4.0 (*)    Leukocytes, UA MODERATE (*)    All other components within normal limits  URINE MICROSCOPIC-ADD ON - Abnormal; Notable for the following:    Squamous Epithelial / LPF MANY (*)    Bacteria, UA FEW (*)    All other components within normal limits  CBC WITH DIFFERENTIAL    Imaging Review Ct Head Wo Contrast  03/13/2014   CLINICAL DATA:  Dizziness, fall  EXAM: CT HEAD WITHOUT CONTRAST  TECHNIQUE: Contiguous axial images were obtained from the base of the skull through the vertex without intravenous contrast.  COMPARISON:  CT HEAD W/O CM dated 01/05/2008  FINDINGS: There is no evidence of mass effect, midline shift, or extra-axial fluid collections. There is no evidence of a space-occupying lesion or intracranial hemorrhage. There is no evidence of a cortical-based area of acute infarction. There is generalized cerebral atrophy.  The ventricles and sulci are appropriate for the patient's age. The basal cisterns are patent.  Visualized portions of the orbits are unremarkable. The visualized portions of the paranasal sinuses and mastoid air cells are unremarkable. Cerebrovascular atherosclerotic calcifications are noted.  The osseous structures are unremarkable.  IMPRESSION: No acute intracranial pathology.   Electronically Signed   By: Elige KoHetal  Patel   On: 03/13/2014 15:08     EKG Interpretation   Date/Time:  Monday March 13 2014 12:38:25 EDT Ventricular Rate:  64 PR Interval:  156 QRS Duration: 86 QT  Interval:  410 QTC Calculation: 422 R Axis:   88 Text Interpretation:  Normal sinus rhythm T wave abnormality, consider  inferior ischemia T wave abnormality, consider anterolateral ischemia  Abnormal ECG When compared with ECG of 24-May-2011 05:32, Criteria for  Lateral infarct are no longer Present Inverted T waves have replaced  nonspecific T wave abnormality in Inferior leads Inverted T waves have  replaced nonspecific T wave abnormality in Lateral leads Confirmed by Cobin Cadavid   MD, Shalene Gallen ([id number]) on 03/13/2014 3:27:00 PM      MDM   Final diagnoses:  Syncope  UTI (lower urinary tract infection)   I personally performed the services described in this documentation, which was scribed in my presence. The recorded information has been reviewed and  is accurate.   Patient is in NAD.    She appears dehydrated.  UA shows infection.   No neurological deficits. Discharge medications Cipro    Donnetta HutchingBrian Zamoria Boss, MD 03/13/14 1654

## 2014-03-13 NOTE — ED Notes (Signed)
[facility] 111 via EMS

## 2015-09-03 ENCOUNTER — Emergency Department (HOSPITAL_COMMUNITY)
Admission: EM | Admit: 2015-09-03 | Discharge: 2015-09-03 | Disposition: A | Discharge status: Home / Self Care | Payer: Medicare Other | Attending: Emergency Medicine | Admitting: Emergency Medicine

## 2015-09-03 ENCOUNTER — Emergency Department (HOSPITAL_COMMUNITY): Payer: Medicare Other

## 2015-09-03 ENCOUNTER — Encounter (HOSPITAL_COMMUNITY): Payer: Self-pay | Admitting: *Deleted

## 2015-09-03 DIAGNOSIS — Z8719 Personal history of other diseases of the digestive system: Secondary | ICD-10-CM | POA: Diagnosis not present

## 2015-09-03 DIAGNOSIS — M25511 Pain in right shoulder: Secondary | ICD-10-CM | POA: Diagnosis not present

## 2015-09-03 DIAGNOSIS — M25512 Pain in left shoulder: Secondary | ICD-10-CM

## 2015-09-03 DIAGNOSIS — Z7982 Long term (current) use of aspirin: Secondary | ICD-10-CM | POA: Diagnosis not present

## 2015-09-03 DIAGNOSIS — H538 Other visual disturbances: Secondary | ICD-10-CM | POA: Diagnosis not present

## 2015-09-03 DIAGNOSIS — R52 Pain, unspecified: Secondary | ICD-10-CM | POA: Diagnosis not present

## 2015-09-03 DIAGNOSIS — Z79899 Other long term (current) drug therapy: Secondary | ICD-10-CM | POA: Insufficient documentation

## 2015-09-03 DIAGNOSIS — Z86711 Personal history of pulmonary embolism: Secondary | ICD-10-CM | POA: Diagnosis not present

## 2015-09-03 DIAGNOSIS — Z8659 Personal history of other mental and behavioral disorders: Secondary | ICD-10-CM | POA: Diagnosis not present

## 2015-09-03 DIAGNOSIS — Q639 Congenital malformation of kidney, unspecified: Secondary | ICD-10-CM | POA: Insufficient documentation

## 2015-09-03 DIAGNOSIS — R51 Headache: Secondary | ICD-10-CM | POA: Diagnosis not present

## 2015-09-03 DIAGNOSIS — M79602 Pain in left arm: Secondary | ICD-10-CM | POA: Diagnosis not present

## 2015-09-03 LAB — CBC WITH DIFFERENTIAL/PLATELET
BASOS PCT: 0 %
Basophils Absolute: 0 10*3/uL (ref 0.0–0.1)
EOS ABS: 0 10*3/uL (ref 0.0–0.7)
Eosinophils Relative: 0 %
HCT: 36.3 % (ref 36.0–46.0)
Hemoglobin: 11.9 g/dL — ABNORMAL LOW (ref 12.0–15.0)
LYMPHS ABS: 2.6 10*3/uL (ref 0.7–4.0)
Lymphocytes Relative: 41 %
MCH: 29.8 pg (ref 26.0–34.0)
MCHC: 32.8 g/dL (ref 30.0–36.0)
MCV: 91 fL (ref 78.0–100.0)
Monocytes Absolute: 0.6 10*3/uL (ref 0.1–1.0)
Monocytes Relative: 9 %
Neutro Abs: 3.1 10*3/uL (ref 1.7–7.7)
Neutrophils Relative %: 50 %
Platelets: 183 10*3/uL (ref 150–400)
RBC: 3.99 MIL/uL (ref 3.87–5.11)
RDW: 12.4 % (ref 11.5–15.5)
WBC: 6.3 10*3/uL (ref 4.0–10.5)

## 2015-09-03 LAB — BASIC METABOLIC PANEL
Anion gap: 6 (ref 5–15)
BUN: 9 mg/dL (ref 6–20)
CO2: 27 mmol/L (ref 22–32)
Calcium: 8.6 mg/dL — ABNORMAL LOW (ref 8.9–10.3)
Chloride: 105 mmol/L (ref 101–111)
Creatinine, Ser: 0.72 mg/dL (ref 0.44–1.00)
GFR calc Af Amer: >60 mL/min (ref >60)
GFR calc non Af Amer: >60 mL/min (ref >60)
Glucose, Bld: 132 mg/dL — ABNORMAL HIGH (ref 65–99)
Potassium: 3.5 mmol/L (ref 3.5–5.1)
Sodium: 138 mmol/L (ref 135–145)

## 2015-09-03 LAB — MAGNESIUM: MAGNESIUM: 1.4 mg/dL — AB (ref 1.7–2.4)

## 2015-09-03 LAB — I-STAT TROPONIN, ED: TROPONIN I, POC: 0 ng/mL (ref 0.00–0.08)

## 2015-09-03 LAB — TROPONIN I

## 2015-09-03 MED ORDER — HYDROCODONE-ACETAMINOPHEN 5-325 MG PO TABS
1.0000 | ORAL_TABLET | Freq: Once | ORAL | Status: AC
  Administered 2015-09-03: 1 via ORAL
  Filled 2015-09-03: qty 1

## 2015-09-03 NOTE — ED Notes (Signed)
Patient reports waking up after a nap this afternoon with left arm pain. Denies headache, vision changes, or any other pain. Rates left arm pain 10/10. Patient has painful area to right shoulder and states she has been "scracthing" it for awhile.

## 2015-09-03 NOTE — Discharge Instructions (Signed)
Return for worsening symptoms, including worsening pain, difficulty breathing, or any other symptoms concerning to you. Take tylenol and motrin as needed for pain control. Ice or use heat packs while at rest.  Shoulder Pain The shoulder is the joint that connects your arms to your body. The bones that form the shoulder joint include the upper arm bone (humerus), the shoulder blade (scapula), and the collarbone (clavicle). The top of the humerus is shaped like a ball and fits into a rather flat socket on the scapula (glenoid cavity). A combination of muscles and strong, fibrous tissues that connect muscles to bones (tendons) support your shoulder joint and hold the ball in the socket. Small, fluid-filled sacs (bursae) are located in different areas of the joint. They act as cushions between the bones and the overlying soft tissues and help reduce friction between the gliding tendons and the bone as you move your arm. Your shoulder joint allows a wide range of motion in your arm. This range of motion allows you to do things like scratch your back or throw a ball. However, this range of motion also makes your shoulder more prone to pain from overuse and injury. Causes of shoulder pain can originate from both injury and overuse and usually can be grouped in the following four categories:  Redness, swelling, and pain (inflammation) of the tendon (tendinitis) or the bursae (bursitis).  Instability, such as a dislocation of the joint.  Inflammation of the joint (arthritis).  Broken bone (fracture). HOME CARE INSTRUCTIONS   Apply ice to the sore area.  Put ice in a plastic bag.  Place a towel between your skin and the bag.  Leave the ice on for 15-20 minutes, 3-4 times per day for the first 2 days, or as directed by your health care provider.  Stop using cold packs if they do not help with the pain.  If you have a shoulder sling or immobilizer, wear it as long as your caregiver instructs. Only  remove it to shower or bathe. Move your arm as little as possible, but keep your hand moving to prevent swelling.  Squeeze a soft ball or foam pad as much as possible to help prevent swelling.  Only take over-the-counter or prescription medicines for pain, discomfort, or fever as directed by your caregiver. SEEK MEDICAL CARE IF:   Your shoulder pain increases, or new pain develops in your arm, hand, or fingers.  Your hand or fingers become cold and numb.  Your pain is not relieved with medicines. SEEK IMMEDIATE MEDICAL CARE IF:   Your arm, hand, or fingers are numb or tingling.  Your arm, hand, or fingers are significantly swollen or turn white or blue. MAKE SURE YOU:   Understand these instructions.  Will watch your condition.  Will get help right away if you are not doing well or get worse.   This information is not intended to replace advice given to you by your health care provider. Make sure you discuss any questions you have with your health care provider.   Document Released: 08/13/2005 Document Revised: 11/24/2014 Document Reviewed: 02/26/2015 Elsevier Interactive Patient Education [employer]2016 [employer]

## 2015-09-03 NOTE — ED Provider Notes (Signed)
CSN: [id number]     Arrival date & time 09/03/15  1542 History   First MD Initiated Contact with Patient 09/03/15 1555     Chief Complaint  Patient presents with  . Arm Pain     (Consider location/radiation/quality/duration/timing/severity/associated sxs/prior Treatment) HPI 74 year old history of schizophrenia and prior history of PE not on anticoagulation. Reports that she woke up from an afternoon nap with severe left shoulder pain that radiated down the left arm and into her chest. Has never had pain like this before in the past. Called EMS, and was brought to the ED. Says that since she woke up with her pain symptoms have been improving on their own.Pain is worse when she moves her left arm. Denies any associating trauma, but is unsure if she may have slept on her left shoulder wrong. Denies any associating neck pain, numbness or weakness, nausea, vomiting, diaphoresis, difficulty breathing, cough, fevers or chills.   Past Medical History  Diagnosis Date  . PE (pulmonary embolism)   . Schizophrenia (HCC)   . Biliary acute pancreatitis 2006    s/p ERCP with stent, sphincterotomy, stone extraction, subsequent stent removal  several weeks later  . Kidney anomaly, congenital     solitary left kidney   Past Surgical History  Procedure Laterality Date  . Cholecystectomy    . Umbilical hernia repair    . Abdominal hysterectomy     Family History  Problem Relation Age of Onset  . Colon cancer      Mother, Brother, unsure age   Social History  Substance Use Topics  . Smoking status: Never Smoker   . Smokeless tobacco: None  . Alcohol Use: No   OB History    No data available     Review of Systems 10/14 systems reviewed and are negative other than those stated in the HPI    Allergies  Review of patient's allergies indicates no known allergies.  Home Medications   Prior to Admission medications   Medication Sig Start Date End Date Taking? Authorizing Provider   ALPRAZolam Prudy Feeler(XANAX) 0.5 MG tablet Take 0.25 mg by mouth 2 (two) times daily as needed for anxiety. Sleep/Nerves   Yes Historical Provider, MD  aspirin EC 81 MG tablet Take 81 mg by mouth daily.   Yes Historical Provider, MD  metoprolol tartrate (LOPRESSOR) 25 MG tablet Take 12.5 mg by mouth 2 (two) times daily.    Yes Historical Provider, MD   BP 131/78 mmHg  Pulse 66  Temp(Src) 98.1 F (36.7 C) (Oral)  Resp 18  Wt 120 lb (54.432 kg)  SpO2 97% Physical Exam Physical Exam  Nursing note and vitals reviewed. Constitutional: Well developed, well nourished, non-toxic, and in no acute distress Head: Normocephalic and atraumatic.  Mouth/Throat: Oropharynx is clear and moist.  Neck: Normal range of motion. Neck supple.  Cardiovascular: Normal rate and regular rhythm.  No edema. +2 symmetric radial pulses. Pulmonary/Chest: Effort normal and breath sounds normal. no chest wall tenderness.  Abdominal: Soft. There is no tenderness. There is no rebound and no guarding.  Musculoskeletal: No deformity of the left shoulder or associated swelling. Fully reproducible pain with range of motion of the left shoulder and palpation of the left shoulder anteriorly.  Neurological: Alert, no facial droop, fluent speech, moves all extremities symmetrically, sensation intact to light touch Skin: Skin is warm and dry.  Psychiatric: Cooperative  ED Course  Procedures (including critical care time) Labs Review Labs Reviewed  CBC WITH DIFFERENTIAL/PLATELET - Abnormal; Notable  for the following:    Hemoglobin 11.9 (*)    All other components within normal limits  BASIC METABOLIC PANEL - Abnormal; Notable for the following:    Glucose, Bld 132 (*)    Calcium 8.6 (*)    All other components within normal limits  MAGNESIUM - Abnormal; Notable for the following:    Magnesium 1.4 (*)    All other components within normal limits  TROPONIN I  I-STAT TROPOININ, ED    Imaging Review Dg Chest 2 View  09/03/2015   CLINICAL DATA:  Patient reports waking up after a nap this afternoon with left arm pain. Denies headache, vision changes, or any other pain. EXAM: CHEST  2 VIEW COMPARISON:  03/03/2012 FINDINGS: There is mild bilateral interstitial thickening. There is no focal parenchymal opacity. There is no pleural effusion or pneumothorax. There is stable cardiomegaly. There is moderate bilateral glenohumeral osteoarthritis. IMPRESSION: 1. No acute disease of the chest. 2. Moderate bilateral glenohumeral osteoarthritis. Electronically Signed   By: Elige Ko   On: 09/03/2015 16:56   Dg Shoulder Left  09/03/2015  CLINICAL DATA:  74 year old female with left arm pain after a nap. Also has pain to right shoulder. EXAM: LEFT SHOULDER - 2+ VIEW COMPARISON:  Multiple prior plain film dating to 08/06/2009, with most recent 09/03/2015 FINDINGS: No acute fracture identified. Irregularity at the medial/ inferior humeral head was present on prior plain film, best visualized on chest x-ray 09/03/2010. Degenerative changes of the acromioclavicular joint. Glenohumeral joint appears congruent. Unremarkable appearance of the visualized left hemi thorax. IMPRESSION: No acute bony abnormality. Irregularity at the medial inferior humeral head was present on comparison chest x-rays, likely degenerative. Signed, Yvone Neu. Loreta Ave, DO [facility] Electronically Signed   By: Gilmer Mor D.O.   On: 09/03/2015 16:54   I have personally reviewed and evaluated these images and lab results as part of my medical decision-making.   EKG Interpretation   Date/Time:  Monday September 03 2015 15:49:27 EDT Ventricular Rate:  61 PR Interval:  160 QRS Duration: 92 QT Interval:  553 QTC Calculation: 557 R Axis:   88 Text Interpretation:  Sinus rhythm Borderline right axis deviation  Prolonged QT interval TWI in inferior and lateral precordial leads, not  changed from prior EKG Confirmed  by LIU MD, DANA ([id number]) on 09/03/2015  4:03:45 PM      MDM   Final diagnoses:  Left shoulder pain    74 year old female who presents with left shoulder pain. Is well-appearing and in no acute distress on presentation. Vital signs are not concerning. Cardiopulmonary exam is unremarkable. Pain is reproducible with range of motion of the left shoulder as well as palpation of the anterior left shoulder. X-rays of her shoulder reveals evidence of osteoarthritis, which is the likely etiology of her symptoms. Chest x-ray shows no acute cardiopulmonary processes. EKG is not acutely ischemic and serial troponins are negative. No major risk factors for ACS aside from her age, and felt that this is adequate rule out for her. History of PE, but pain is not pleuritic, and she is not dyspneic or tachycardic. Given reproducibility of her pain, this is most consistent with musculoskeletal pain. Improved with a dose of Norco. Discussed supportive care for home. Strict return follow-up instructions are reviewed. She expressed understanding of all discharge instructions for comfortable to plan of care.    Lavera Guise, MD 09/04/15 [phone]

## 2015-09-03 NOTE — ED Notes (Signed)
[place] [employer] offered a curiosity ride for pt.

## 2015-09-03 NOTE — ED Notes (Signed)
Patient is tachypneic but denies chest pain or shortness of breath.

## 2015-09-03 NOTE — ED Notes (Signed)
Pt alert & oriented x4, stable gait. Patient  given discharge instructions, paperwork & prescription(s). Patient verbalized understanding. Pt left department w/ no further questions. 

## 2015-10-10 DIAGNOSIS — F419 Anxiety disorder, unspecified: Secondary | ICD-10-CM | POA: Diagnosis not present

## 2015-10-10 DIAGNOSIS — Z23 Encounter for immunization: Secondary | ICD-10-CM | POA: Diagnosis not present

## 2015-10-10 DIAGNOSIS — F2081 Schizophreniform disorder: Secondary | ICD-10-CM | POA: Diagnosis not present

## 2015-10-10 DIAGNOSIS — I10 Essential (primary) hypertension: Secondary | ICD-10-CM | POA: Diagnosis not present

## 2015-10-10 DIAGNOSIS — J449 Chronic obstructive pulmonary disease, unspecified: Secondary | ICD-10-CM | POA: Diagnosis not present

## 2015-10-24 DIAGNOSIS — R61 Generalized hyperhidrosis: Secondary | ICD-10-CM | POA: Diagnosis not present

## 2016-02-07 ENCOUNTER — Other Ambulatory Visit (HOSPITAL_COMMUNITY): Payer: Self-pay | Admitting: Pulmonary Disease

## 2016-02-07 DIAGNOSIS — Z780 Asymptomatic menopausal state: Secondary | ICD-10-CM

## 2016-02-07 DIAGNOSIS — Z0000 Encounter for general adult medical examination without abnormal findings: Secondary | ICD-10-CM | POA: Diagnosis not present

## 2016-02-21 ENCOUNTER — Ambulatory Visit (HOSPITAL_COMMUNITY)
Admission: RE | Admit: 2016-02-21 | Discharge: 2016-02-21 | Disposition: A | Discharge status: Home / Self Care | Payer: Medicare Other | Source: Ambulatory Visit | Attending: Pulmonary Disease | Admitting: Pulmonary Disease

## 2016-02-21 DIAGNOSIS — Z780 Asymptomatic menopausal state: Secondary | ICD-10-CM | POA: Diagnosis not present

## 2016-02-21 DIAGNOSIS — M129 Arthropathy, unspecified: Secondary | ICD-10-CM | POA: Diagnosis not present

## 2016-02-21 DIAGNOSIS — Z0000 Encounter for general adult medical examination without abnormal findings: Secondary | ICD-10-CM | POA: Diagnosis not present

## 2016-02-21 DIAGNOSIS — N3281 Overactive bladder: Secondary | ICD-10-CM | POA: Diagnosis not present

## 2016-02-21 DIAGNOSIS — M818 Other osteoporosis without current pathological fracture: Secondary | ICD-10-CM | POA: Insufficient documentation

## 2016-02-21 DIAGNOSIS — M810 Age-related osteoporosis without current pathological fracture: Secondary | ICD-10-CM | POA: Diagnosis not present

## 2016-02-21 DIAGNOSIS — G43109 Migraine with aura, not intractable, without status migrainosus: Secondary | ICD-10-CM | POA: Diagnosis not present

## 2016-02-21 DIAGNOSIS — F419 Anxiety disorder, unspecified: Secondary | ICD-10-CM | POA: Diagnosis not present

## 2016-02-29 DIAGNOSIS — R197 Diarrhea, unspecified: Secondary | ICD-10-CM | POA: Diagnosis not present

## 2016-09-26 ENCOUNTER — Encounter (HOSPITAL_COMMUNITY): Payer: Self-pay

## 2016-09-26 ENCOUNTER — Emergency Department (HOSPITAL_COMMUNITY): Payer: Medicare Other

## 2016-09-26 ENCOUNTER — Emergency Department (HOSPITAL_COMMUNITY)
Admission: EM | Admit: 2016-09-26 | Discharge: 2016-09-26 | Disposition: A | Discharge status: Home / Self Care | Payer: Medicare Other | Attending: Emergency Medicine | Admitting: Emergency Medicine

## 2016-09-26 DIAGNOSIS — Z79899 Other long term (current) drug therapy: Secondary | ICD-10-CM | POA: Insufficient documentation

## 2016-09-26 DIAGNOSIS — Z7982 Long term (current) use of aspirin: Secondary | ICD-10-CM | POA: Diagnosis not present

## 2016-09-26 DIAGNOSIS — R42 Dizziness and giddiness: Secondary | ICD-10-CM | POA: Diagnosis present

## 2016-09-26 DIAGNOSIS — R531 Weakness: Secondary | ICD-10-CM | POA: Diagnosis not present

## 2016-09-26 LAB — CBC WITH DIFFERENTIAL/PLATELET
Basophils Absolute: 0 10*3/uL (ref 0.0–0.1)
Basophils Relative: 0 %
Eosinophils Absolute: 0 10*3/uL (ref 0.0–0.7)
Eosinophils Relative: 0 %
HEMATOCRIT: 40 % (ref 36.0–46.0)
HEMOGLOBIN: 13.2 g/dL (ref 12.0–15.0)
LYMPHS ABS: 1.9 10*3/uL (ref 0.7–4.0)
LYMPHS PCT: 29 %
MCH: 30.5 pg (ref 26.0–34.0)
MCHC: 33 g/dL (ref 30.0–36.0)
MCV: 92.4 fL (ref 78.0–100.0)
MONOS PCT: 8 %
Monocytes Absolute: 0.5 10*3/uL (ref 0.1–1.0)
NEUTROS ABS: 4 10*3/uL (ref 1.7–7.7)
NEUTROS PCT: 63 %
Platelets: 148 10*3/uL — ABNORMAL LOW (ref 150–400)
RBC: 4.33 MIL/uL (ref 3.87–5.11)
RDW: 12.4 % (ref 11.5–15.5)
WBC: 6.3 10*3/uL (ref 4.0–10.5)

## 2016-09-26 LAB — COMPREHENSIVE METABOLIC PANEL
ALK PHOS: 54 U/L (ref 38–126)
ALT: 25 U/L (ref 14–54)
ANION GAP: 6 (ref 5–15)
AST: 30 U/L (ref 15–41)
Albumin: 3.9 g/dL (ref 3.5–5.0)
BILIRUBIN TOTAL: 1.2 mg/dL (ref 0.3–1.2)
BUN: 13 mg/dL (ref 6–20)
CALCIUM: 8.8 mg/dL — AB (ref 8.9–10.3)
CO2: 28 mmol/L (ref 22–32)
CREATININE: 0.81 mg/dL (ref 0.44–1.00)
Chloride: 99 mmol/L — ABNORMAL LOW (ref 101–111)
GFR calc non Af Amer: >60 mL/min (ref >60)
Glucose, Bld: 106 mg/dL — ABNORMAL HIGH (ref 65–99)
Potassium: 4.2 mmol/L (ref 3.5–5.1)
Sodium: 133 mmol/L — ABNORMAL LOW (ref 135–145)
TOTAL PROTEIN: 7.8 g/dL (ref 6.5–8.1)

## 2016-09-26 LAB — URINE MICROSCOPIC-ADD ON

## 2016-09-26 LAB — URINALYSIS, ROUTINE W REFLEX MICROSCOPIC
Bilirubin Urine: NEGATIVE
GLUCOSE, UA: NEGATIVE mg/dL
Ketones, ur: NEGATIVE mg/dL
LEUKOCYTES UA: NEGATIVE
NITRITE: NEGATIVE
Protein, ur: NEGATIVE mg/dL
Specific Gravity, Urine: <1.005 — ABNORMAL LOW (ref 1.005–1.030)
pH: 6 (ref 5.0–8.0)

## 2016-09-26 LAB — D-DIMER, QUANTITATIVE: D-Dimer, Quant: 0.43 ug{FEU}/mL (ref 0.00–0.50)

## 2016-09-26 LAB — I-STAT TROPONIN, ED
Troponin i, poc: 0 ng/mL (ref 0.00–0.08)
Troponin i, poc: 0 ng/mL (ref 0.00–0.08)

## 2016-09-26 MED ORDER — SODIUM CHLORIDE 0.9 % IV BOLUS (SEPSIS)
1000.0000 mL | Freq: Once | INTRAVENOUS | Status: AC
  Administered 2016-09-26: 1000 mL via INTRAVENOUS

## 2016-09-26 NOTE — ED Provider Notes (Signed)
Patient rechecked at 1900: She appears in no acute distress. Labs were reviewed. She appears stable to be discharged.   Donnetta HutchingBrian Joeli Fenner, MD 09/26/16 [phone]

## 2016-09-26 NOTE — ED Provider Notes (Signed)
[facility]-EMER[facility]ENCY DEPT Provider Note   CSN: [id number] Arrival date & time: 09/26/16  1231     History   Chief Complaint Chief Complaint  Patient presents with  . Fatigue    HPI [name] Long is a 75 y.o. female With a past medical history significant for pulmonary embolism, schizophrenia, And prior urinary tract infection who presents with lightheadedness. Patient reports earlier today, she stood up and felt lightheaded. She did not lose consciousness or syncopize but did have to sit down. She reports that after sitting back down, she felt better. She reports that this is not the first time this is happened to her. She denies any chest pain, shortness of breath, palpitations, nausea, vomiting, cough, constipation, diarrhea, or dysuria. She does report decreased PO intake's of the last several days and reports some urinary frequency.  She denies any other symptoms.   The history is provided by the patient and medical records. No language interpreter was used.  Dizziness  Quality:  Lightheadedness Severity:  Moderate Onset quality:  Sudden Duration:  2 hours Timing:  Rare Progression:  Resolved Chronicity:  New Context: standing up   Relieved by:  Lying down Worsened by:  Standing up Ineffective treatments:  None tried Associated symptoms: no chest pain, no diarrhea, no headaches, no nausea, no palpitations, no shortness of breath, no syncope, no vision changes, no vomiting and no weakness   Risk factors: no new medications     Past Medical History:  Diagnosis Date  . Biliary acute pancreatitis 2006   s/p ERCP with stent, sphincterotomy, stone extraction, subsequent stent removal  several weeks later  . Kidney anomaly, congenital    solitary left kidney  . PE (pulmonary embolism)   . Schizophrenia [facility])     Patient Active Problem List   Diagnosis Date Noted  . Screening for colon cancer 10/02/2011    Past Surgical History:  Procedure Laterality Date  . ABDOMINAL  HYSTERECTOMY    . CHOLECYSTECTOMY    . UMBILICAL HERNIA REPAIR      OB History    No data available       Home Medications    Prior to Admission medications   Medication Sig Start Date End Date Taking? Authorizing Provider  aspirin EC 81 MG tablet Take 81 mg by mouth daily.   Yes Historical Provider, MD  atorvastatin (LIPITOR) 20 MG tablet 1 tablet daily. 06/27/16  Yes Historical Provider, MD  metoprolol tartrate (LOPRESSOR) 25 MG tablet Take 12.5 mg by mouth 2 (two) times daily.    Yes Historical Provider, MD  risperiDONE (RISPERDAL) 0.5 MG tablet 1 tablet 2 (two) times daily. 06/27/16  Yes Historical Provider, MD  ALPRAZolam Prudy Feeler) 0.5 MG tablet Take 0.25 mg by mouth 2 (two) times daily as needed for anxiety. Sleep/Nerves    Historical Provider, MD  amoxicillin (AMOXIL) 500 MG capsule 1 capsule 3 (three) times daily. 09/11/16   Historical Provider, MD    Family History Family History  Problem Relation Age of Onset  . Colon cancer      Mother, Brother, unsure age    Social History Social History  Substance Use Topics  . Smoking status: Never Smoker  . Smokeless tobacco: Never Used  . Alcohol use No     Allergies   Patient has no known allergies.   Review of Systems Review of Systems  Constitutional: Negative for activity change, chills, diaphoresis, fatigue and fever.  HENT: Negative for congestion and rhinorrhea.   Eyes: Negative for  visual disturbance.  Respiratory: Negative for cough, choking, chest tightness, shortness of breath and stridor.   Cardiovascular: Negative for chest pain, palpitations, leg swelling and syncope.  Gastrointestinal: Negative for abdominal distention, abdominal pain, constipation, diarrhea, nausea and vomiting.  Genitourinary: Negative for difficulty urinating, dysuria, flank pain, frequency, hematuria, menstrual problem, pelvic pain, vaginal bleeding and vaginal discharge.  Musculoskeletal: Negative for back pain and neck pain.  Skin:  Negative for rash and wound.  Neurological: Positive for light-headedness. Negative for dizziness, seizures, syncope, facial asymmetry, weakness, numbness and headaches.  Psychiatric/Behavioral: Negative for agitation and confusion.  All other systems reviewed and are negative.    Physical Exam Updated Vital Signs BP 104/70 (BP Location: Left Arm)   Pulse 82   Temp 98.2 F (36.8 C) (Oral)   Resp 16   Ht 5\' 6"  (1.676 m)   Wt 120 lb (54.4 kg)   SpO2 100%   BMI 19.37 kg/m   Physical Exam  Constitutional: She is oriented to person, place, and time. She appears well-developed and well-nourished. No distress.  HENT:  Head: Normocephalic and atraumatic.  Mouth/Throat: Oropharynx is clear and moist. No oropharyngeal exudate.  Eyes: Conjunctivae are normal.  Neck: Neck supple.  Cardiovascular: Normal rate and regular rhythm.   No murmur heard. Pulmonary/Chest: Effort normal and breath sounds normal. No respiratory distress. She has no wheezes. She has no rales. She exhibits no tenderness.  Abdominal: Soft. There is no tenderness.  Musculoskeletal: She exhibits no edema or tenderness.  Neurological: She is alert and oriented to person, place, and time. She has normal strength. She displays no tremor and normal reflexes. No cranial nerve deficit or sensory deficit. She exhibits normal muscle tone. Coordination and gait normal. GCS eye subscore is 4. GCS verbal subscore is 5. GCS motor subscore is 6.  Patient had normal gait. Patient felt lightheaded when she stood up. Normal coordination.  Skin: Skin is warm and dry. Capillary refill takes less than 2 seconds. No rash noted.  Psychiatric: She has a normal mood and affect.  Nursing note and vitals reviewed.    ED Treatments / Results  Labs (all labs ordered are listed, but only abnormal results are displayed) Labs Reviewed  CBC WITH DIFFERENTIAL/PLATELET - Abnormal; Notable for the following:       Result Value   Platelets 148 (*)      All other components within normal limits  COMPREHENSIVE METABOLIC PANEL - Abnormal; Notable for the following:    Sodium 133 (*)    Chloride 99 (*)    Glucose, Bld 106 (*)    Calcium 8.8 (*)    All other components within normal limits  URINALYSIS, ROUTINE W REFLEX MICROSCOPIC (NOT AT [facility]) - Abnormal; Notable for the following:    Specific Gravity, Urine <1.005 (*)    Hgb urine dipstick MODERATE (*)    All other components within normal limits  URINE MICROSCOPIC-ADD ON - Abnormal; Notable for the following:    Squamous Epithelial / LPF 0-5 (*)    Bacteria, UA RARE (*)    All other components within normal limits  D-DIMER, QUANTITATIVE (NOT AT [facility])  I-STAT TROPOININ, ED  I-STAT TROPOININ, ED    EKG  EKG Interpretation  Date/Time:  Friday September 26 2016 12:41:50 EST Ventricular Rate:  58 PR Interval:    QRS Duration: 94 QT Interval:  440 QTC Calculation: 433 R Axis:   91 Text Interpretation:  Sinus rhythm Paired ventricular premature complexes Right axis deviation Low voltage,  precordial leads Nonspecific T abnormalities, diffuse leads Baseline wander in lead(s) V3 ECG appears unchanged from prior.  No STEMI Confirmed by Rush LandmarkEGELER MD, Suleman Gunning [phone]([id number]) on 09/26/2016 2:33:30 PM       Radiology Dg Chest 2 View  Result Date: 09/26/2016 CLINICAL DATA:  Weakness and dizziness upon arising. History of previous pulmonary embolism. EXAM: CHEST  2 VIEW COMPARISON:  PA and lateral chest x-ray of September 03, 2015 FINDINGS: The lungs remain hyperinflated. There is no focal infiltrate. The cardiac silhouette is top-normal in size and stable. The pulmonary vascularity is not engorged. There are coarse retrocardiac lung markings which are stable. There is no pleural effusion. There is calcification in the wall of the aortic arch. There are degenerative changes of both shoulders. IMPRESSION: COPD.  Mild cardiomegaly.  No pneumonia nor CHF. Aortic atherosclerosis. Electronically Signed    By: David  SwazilandJordan M.D.   On: 09/26/2016 15:12   Ct Head Wo Contrast  Result Date: 09/26/2016 CLINICAL DATA:  Weakness and dizziness EXAM: CT HEAD WITHOUT CONTRAST TECHNIQUE: Contiguous axial images were obtained from the base of the skull through the vertex without intravenous contrast. COMPARISON:  03/13/2014 FINDINGS: Brain: No evidence of acute infarction, hemorrhage, hydrocephalus, extra-axial collection or mass lesion/mass effect. Vascular: No hyperdense vessels.  Carotid artery calcifications. Skull: Mastoid air cells clear.  No fracture. Sinuses/Orbits: Mild mucosal thickening within the ethmoid and sphenoid sinuses. Probable postsurgical defects within the medial maxillary sinuses. No acute orbital abnormality. Other: None IMPRESSION: No CT evidence for acute intracranial abnormality. Electronically Signed   By: Jasmine PangKim  Fujinaga M.D.   On: 09/26/2016 15:22    Procedures Procedures (including critical care time)  Medications Ordered in ED Medications  sodium chloride 0.9 % bolus 1,000 mL (0 mLs Intravenous Stopped 09/26/16 1839)     Initial Impression / Assessment and Plan / ED Course  I have reviewed the triage vital signs and the nursing notes.  Pertinent labs & imaging results that were available during my care of the patient were reviewed by me and considered in my medical decision making (see chart for details).  Clinical Course     [name] Long is a 75 y.o. female With a past medical history significant for pulmonary embolism, schizophrenia, And prior urinary tract infection who presents with lightheadedness.  History and exam are seen above.  Patient exam showed normal coordination, normal strength, normal sensation, a normal gait. Lungs were clear, chest was nontender. No unilateral or bilateral leg Tenderness or swelling. Normal neurologic exam. Patient did have some lightheadedness upon standing.  Given patient's history, patient will laboratory, EKG, and x-ray to look  for abnormality. Suspect with urinary frequency and similar symptoms in the past, the patient has a UTI and dehydration causing her symptoms. Doubt cardiac etiology.  EKG appeared unchanged from prior. Troponin negative comedy diver negative, CBC shows no evidence of leukocytosis or anemia. CMP shows unremarkable kidney function or liver function.  CT head unremarkable, chest x-ray Shows no pneumonia or CHF. Mildcardiomegaly.   Patient awaiting urinalysis. Patient unable to urinate for a period of time in ED. Patient given fluids.  Care transferred to Dr. Adriana Simasook all awaiting urinalysis results. The patient has no further symptoms and urine is clear,  Patient will be able to be discharged. Patient will follow up with PCP for further management and be instructed on rehydration. Care transferred in stable condition.   Final Clinical Impressions(s) / ED Diagnoses   Final diagnoses:  Weakness    New Prescriptions  Discharge Medication List as of 09/26/2016  7:02 PM      Clinical Impression: 1. Weakness     Disposition: Transferred care to Dr. Adriana Simas while awaiting urinalysis. Anticipate discharge.  Condition: Tawni Millers Nayib Remer, MD 09/27/16 1017

## 2016-09-26 NOTE — ED Triage Notes (Signed)
Pt reports episode of weakness and dizziness upon getting up to go to door. Then sat back down and feels better. Denies HA, CP or SOB

## 2016-09-26 NOTE — ED Notes (Signed)
Pt transported to CT and X-Ray

## 2016-09-26 NOTE — Discharge Instructions (Signed)
Tests showed no life-threatening condition.  Increase fluids.  Follow-up your primary care doctor. °

## 2017-07-16 DIAGNOSIS — Z0000 Encounter for general adult medical examination without abnormal findings: Secondary | ICD-10-CM | POA: Diagnosis not present

## 2017-07-16 DIAGNOSIS — M129 Arthropathy, unspecified: Secondary | ICD-10-CM | POA: Diagnosis not present

## 2017-07-16 DIAGNOSIS — G43109 Migraine with aura, not intractable, without status migrainosus: Secondary | ICD-10-CM | POA: Diagnosis not present

## 2017-07-16 DIAGNOSIS — N3281 Overactive bladder: Secondary | ICD-10-CM | POA: Diagnosis not present

## 2017-07-16 DIAGNOSIS — Z13220 Encounter for screening for lipoid disorders: Secondary | ICD-10-CM | POA: Diagnosis not present

## 2017-08-20 DIAGNOSIS — Z23 Encounter for immunization: Secondary | ICD-10-CM | POA: Diagnosis not present

## 2017-11-30 DIAGNOSIS — H35361 Drusen (degenerative) of macula, right eye: Secondary | ICD-10-CM | POA: Diagnosis not present

## 2017-11-30 DIAGNOSIS — H43811 Vitreous degeneration, right eye: Secondary | ICD-10-CM | POA: Diagnosis not present

## 2017-11-30 DIAGNOSIS — H35372 Puckering of macula, left eye: Secondary | ICD-10-CM | POA: Diagnosis not present

## 2017-11-30 DIAGNOSIS — H40003 Preglaucoma, unspecified, bilateral: Secondary | ICD-10-CM | POA: Diagnosis not present

## 2017-12-01 ENCOUNTER — Other Ambulatory Visit (HOSPITAL_COMMUNITY)
Admission: RE | Admit: 2017-12-01 | Discharge: 2017-12-01 | Disposition: A | Discharge status: Home / Self Care | Payer: Medicare Other | Source: Ambulatory Visit | Attending: Pulmonary Disease | Admitting: Pulmonary Disease

## 2017-12-01 ENCOUNTER — Other Ambulatory Visit (HOSPITAL_COMMUNITY): Payer: Self-pay | Admitting: Pulmonary Disease

## 2017-12-01 ENCOUNTER — Ambulatory Visit (HOSPITAL_COMMUNITY)
Admission: RE | Admit: 2017-12-01 | Discharge: 2017-12-01 | Disposition: A | Discharge status: Home / Self Care | Payer: Medicare Other | Source: Ambulatory Visit | Attending: Pulmonary Disease | Admitting: Pulmonary Disease

## 2017-12-01 DIAGNOSIS — R0602 Shortness of breath: Secondary | ICD-10-CM | POA: Diagnosis not present

## 2017-12-01 DIAGNOSIS — J90 Pleural effusion, not elsewhere classified: Secondary | ICD-10-CM | POA: Diagnosis not present

## 2017-12-01 DIAGNOSIS — I10 Essential (primary) hypertension: Secondary | ICD-10-CM | POA: Insufficient documentation

## 2017-12-01 DIAGNOSIS — J449 Chronic obstructive pulmonary disease, unspecified: Secondary | ICD-10-CM | POA: Diagnosis not present

## 2017-12-01 DIAGNOSIS — F419 Anxiety disorder, unspecified: Secondary | ICD-10-CM | POA: Insufficient documentation

## 2017-12-01 DIAGNOSIS — M150 Primary generalized (osteo)arthritis: Secondary | ICD-10-CM | POA: Diagnosis not present

## 2017-12-01 LAB — CBC WITH DIFFERENTIAL/PLATELET
Basophils Absolute: 0 10*3/uL (ref 0.0–0.1)
Basophils Relative: 0 %
Eosinophils Absolute: 0.1 10*3/uL (ref 0.0–0.7)
Eosinophils Relative: 1 %
HEMATOCRIT: 42.8 % (ref 36.0–46.0)
Hemoglobin: 13.8 g/dL (ref 12.0–15.0)
LYMPHS ABS: 2.6 10*3/uL (ref 0.7–4.0)
Lymphocytes Relative: 45 %
MCH: 29.9 pg (ref 26.0–34.0)
MCHC: 32.2 g/dL (ref 30.0–36.0)
MCV: 92.6 fL (ref 78.0–100.0)
MONOS PCT: 7 %
Monocytes Absolute: 0.4 10*3/uL (ref 0.1–1.0)
NEUTROS ABS: 2.7 10*3/uL (ref 1.7–7.7)
Neutrophils Relative %: 47 %
Platelets: 153 10*3/uL (ref 150–400)
RBC: 4.62 MIL/uL (ref 3.87–5.11)
RDW: 12.6 % (ref 11.5–15.5)
WBC: 5.7 10*3/uL (ref 4.0–10.5)

## 2017-12-01 LAB — COMPREHENSIVE METABOLIC PANEL
ALT: 13 U/L — ABNORMAL LOW (ref 14–54)
AST: 24 U/L (ref 15–41)
Albumin: 4 g/dL (ref 3.5–5.0)
Alkaline Phosphatase: 52 U/L (ref 38–126)
Anion gap: 13 (ref 5–15)
BILIRUBIN TOTAL: 1.4 mg/dL — AB (ref 0.3–1.2)
BUN: 13 mg/dL (ref 6–20)
CO2: 21 mmol/L — ABNORMAL LOW (ref 22–32)
CREATININE: 0.78 mg/dL (ref 0.44–1.00)
Calcium: 9.1 mg/dL (ref 8.9–10.3)
Chloride: 101 mmol/L (ref 101–111)
Glucose, Bld: 121 mg/dL — ABNORMAL HIGH (ref 65–99)
Potassium: 3.2 mmol/L — ABNORMAL LOW (ref 3.5–5.1)
Sodium: 135 mmol/L (ref 135–145)
TOTAL PROTEIN: 8.4 g/dL — AB (ref 6.5–8.1)

## 2017-12-01 LAB — LIPID PANEL
CHOL/HDL RATIO: 4.8 ratio
CHOLESTEROL: 228 mg/dL — AB (ref 0–200)
HDL: 48 mg/dL (ref >40)
LDL Cholesterol: 163 mg/dL — ABNORMAL HIGH (ref 0–99)
TRIGLYCERIDES: 86 mg/dL (ref <150)
VLDL: 17 mg/dL (ref 0–40)

## 2017-12-01 LAB — TSH: TSH: 1.665 u[IU]/mL (ref 0.350–4.500)

## 2017-12-02 DIAGNOSIS — I739 Peripheral vascular disease, unspecified: Secondary | ICD-10-CM | POA: Diagnosis not present

## 2017-12-02 DIAGNOSIS — L110 Acquired keratosis follicularis: Secondary | ICD-10-CM | POA: Diagnosis not present

## 2017-12-02 LAB — VITAMIN D 25 HYDROXY (VIT D DEFICIENCY, FRACTURES): VIT D 25 HYDROXY: 17.3 ng/mL — AB (ref 30.0–100.0)

## 2017-12-08 ENCOUNTER — Other Ambulatory Visit (HOSPITAL_COMMUNITY): Payer: Self-pay | Admitting: Pulmonary Disease

## 2017-12-08 DIAGNOSIS — R0989 Other specified symptoms and signs involving the circulatory and respiratory systems: Secondary | ICD-10-CM

## 2017-12-14 DIAGNOSIS — H43811 Vitreous degeneration, right eye: Secondary | ICD-10-CM | POA: Diagnosis not present

## 2017-12-14 DIAGNOSIS — H35372 Puckering of macula, left eye: Secondary | ICD-10-CM | POA: Diagnosis not present

## 2017-12-14 DIAGNOSIS — H35361 Drusen (degenerative) of macula, right eye: Secondary | ICD-10-CM | POA: Diagnosis not present

## 2017-12-22 ENCOUNTER — Ambulatory Visit (HOSPITAL_COMMUNITY)
Admission: RE | Admit: 2017-12-22 | Discharge: 2017-12-22 | Disposition: A | Discharge status: Home / Self Care | Payer: Medicare Other | Source: Ambulatory Visit | Attending: Pulmonary Disease | Admitting: Pulmonary Disease

## 2017-12-22 ENCOUNTER — Encounter (HOSPITAL_COMMUNITY): Payer: Self-pay

## 2018-02-25 ENCOUNTER — Encounter (HOSPITAL_COMMUNITY): Payer: Self-pay | Admitting: Emergency Medicine

## 2018-02-25 ENCOUNTER — Other Ambulatory Visit: Payer: Self-pay

## 2018-02-25 ENCOUNTER — Emergency Department (HOSPITAL_COMMUNITY)
Admission: EM | Admit: 2018-02-25 | Discharge: 2018-02-25 | Disposition: A | Discharge status: Home / Self Care | Payer: Medicare Other | Attending: Emergency Medicine | Admitting: Emergency Medicine

## 2018-02-25 DIAGNOSIS — R404 Transient alteration of awareness: Secondary | ICD-10-CM | POA: Diagnosis not present

## 2018-02-25 DIAGNOSIS — E876 Hypokalemia: Secondary | ICD-10-CM | POA: Insufficient documentation

## 2018-02-25 DIAGNOSIS — R55 Syncope and collapse: Secondary | ICD-10-CM | POA: Insufficient documentation

## 2018-02-25 DIAGNOSIS — R531 Weakness: Secondary | ICD-10-CM | POA: Diagnosis not present

## 2018-02-25 DIAGNOSIS — R42 Dizziness and giddiness: Secondary | ICD-10-CM | POA: Diagnosis not present

## 2018-02-25 LAB — BASIC METABOLIC PANEL
Anion gap: 11 (ref 5–15)
BUN: 15 mg/dL (ref 6–20)
CALCIUM: 9 mg/dL (ref 8.9–10.3)
CHLORIDE: 103 mmol/L (ref 101–111)
CO2: 25 mmol/L (ref 22–32)
CREATININE: 0.77 mg/dL (ref 0.44–1.00)
GFR calc non Af Amer: >60 mL/min (ref >60)
Glucose, Bld: 124 mg/dL — ABNORMAL HIGH (ref 65–99)
Potassium: 3.1 mmol/L — ABNORMAL LOW (ref 3.5–5.1)
SODIUM: 139 mmol/L (ref 135–145)

## 2018-02-25 LAB — MAGNESIUM: MAGNESIUM: 1.5 mg/dL — AB (ref 1.7–2.4)

## 2018-02-25 LAB — CBC WITH DIFFERENTIAL/PLATELET
BASOS PCT: 0 %
Basophils Absolute: 0 10*3/uL (ref 0.0–0.1)
EOS PCT: 0 %
Eosinophils Absolute: 0 10*3/uL (ref 0.0–0.7)
HCT: 37.4 % (ref 36.0–46.0)
Hemoglobin: 11.7 g/dL — ABNORMAL LOW (ref 12.0–15.0)
LYMPHS ABS: 2.7 10*3/uL (ref 0.7–4.0)
Lymphocytes Relative: 56 %
MCH: 29.2 pg (ref 26.0–34.0)
MCHC: 31.3 g/dL (ref 30.0–36.0)
MCV: 93.3 fL (ref 78.0–100.0)
MONOS PCT: 6 %
Monocytes Absolute: 0.3 10*3/uL (ref 0.1–1.0)
NEUTROS PCT: 38 %
Neutro Abs: 1.8 10*3/uL (ref 1.7–7.7)
PLATELETS: 185 10*3/uL (ref 150–400)
RBC: 4.01 MIL/uL (ref 3.87–5.11)
RDW: 13.8 % (ref 11.5–15.5)
WBC: 4.8 10*3/uL (ref 4.0–10.5)

## 2018-02-25 LAB — CBG MONITORING, ED: Glucose-Capillary: 93 mg/dL (ref 65–99)

## 2018-02-25 LAB — TROPONIN I: Troponin I: <0.03 ng/mL (ref <0.03)

## 2018-02-25 MED ORDER — MAGNESIUM GLUCONATE 30 MG PO TABS: 30.0000 mg | ORAL_TABLET | Freq: Two times a day (BID) | ORAL | 0 refills | Status: DC

## 2018-02-25 MED ORDER — POTASSIUM CHLORIDE CRYS ER 20 MEQ PO TBCR
40.0000 meq | EXTENDED_RELEASE_TABLET | Freq: Once | ORAL | Status: AC
  Administered 2018-02-25: 40 meq via ORAL
  Filled 2018-02-25: qty 2

## 2018-02-25 MED ORDER — LACTATED RINGERS IV BOLUS
1000.0000 mL | Freq: Once | INTRAVENOUS | Status: AC
  Administered 2018-02-25: 1000 mL via INTRAVENOUS

## 2018-02-25 MED ORDER — POTASSIUM CHLORIDE CRYS ER 20 MEQ PO TBCR: 40.0000 meq | EXTENDED_RELEASE_TABLET | Freq: Two times a day (BID) | ORAL | 0 refills | Status: DC

## 2018-02-25 MED ORDER — POTASSIUM CHLORIDE 10 MEQ/100ML IV SOLN
10.0000 meq | Freq: Once | INTRAVENOUS | Status: DC
  Filled 2018-02-25: qty 100

## 2018-02-25 MED ORDER — MAGNESIUM SULFATE 2 GM/50ML IV SOLN
2.0000 g | Freq: Once | INTRAVENOUS | Status: AC
  Administered 2018-02-25: 2 g via INTRAVENOUS
  Filled 2018-02-25: qty 50

## 2018-02-25 NOTE — ED Triage Notes (Signed)
Pt states she had been standing in line at business and felt faint.

## 2018-02-25 NOTE — ED Provider Notes (Signed)
Emergency Department Provider Note   I have reviewed the triage vital signs and the nursing notes.   HISTORY  Chief Complaint Near Syncope   HPI [name] Long is a 77 y.o. female without significant past medical history aside from pulmonary embolus that presents to the emergency department today with an episode of syncope.  Patient had been standing in line at the [employer] and then felt a little bit nauseous and sweaty for a few seconds and then everything started to go black so she laid herself on the floor and proceeded to lose consciousness for an unknown period of time.  She states that she laid herself down and did not suffer any trauma to her head or extremities and is in no pain at this time.  She states she is asymptomatic this time.  She states this is happened one time quite a while back but is not a regular occurrence.  She is had decreased intake recently but no other associated symptoms.  She is had nausea, vomiting, diarrhea, chest pain, cough, palpitations or headache. No other associated or modifying symptoms.    Past Medical History:  Diagnosis Date  . Biliary acute pancreatitis 2006   s/p ERCP with stent, sphincterotomy, stone extraction, subsequent stent removal  several weeks later  . Kidney anomaly, congenital    solitary left kidney  . PE (pulmonary embolism)   . Schizophrenia [facility])     Patient Active Problem List   Diagnosis Date Noted  . Screening for colon cancer 10/02/2011    Past Surgical History:  Procedure Laterality Date  . ABDOMINAL HYSTERECTOMY    . CHOLECYSTECTOMY    . UMBILICAL HERNIA REPAIR      Current Outpatient Rx  . Order #: [id number] Class: Historical Med  . Order #: [id number] Class: Historical Med  . Order #: [id number] Class: Historical Med  . Order #: [id number] Class: Historical Med  . Order #: [id number] Class: Historical Med  . Order #: [id number] Class: Print  . Order #: [id number] Class: Print    Allergies Patient has no known  allergies.  Family History  Problem Relation Age of Onset  . Colon cancer Unknown        Mother, Brother, unsure age    Social History Social History   Tobacco Use  . Smoking status: Never Smoker  . Smokeless tobacco: Never Used  Substance Use Topics  . Alcohol use: No  . Drug use: No    Review of Systems  All other systems negative except as documented in the HPI. All pertinent positives and negatives as reviewed in the HPI. ____________________________________________   PHYSICAL EXAM:  VITAL SIGNS: ED Triage Vitals  Enc Vitals Group     BP 02/25/18 0917 (!) 130/116     Pulse Rate 02/25/18 0917 (!) 56     Resp 02/25/18 0917 11     Temp 02/25/18 0917 (!) 97.4 F (36.3 C)     Temp Source 02/25/18 0917 Oral     SpO2 02/25/18 0917 100 %     Weight 02/25/18 0913 120 lb (54.4 kg)    Constitutional: Alert and oriented. Well appearing and in no acute distress. Eyes: Conjunctivae are normal. PERRL. EOMI. Head: Atraumatic. Nose: No congestion/rhinnorhea. Mouth/Throat: Mucous membranes are moist.  Oropharynx non-erythematous. Neck: No stridor.  No meningeal signs.   Cardiovascular: Normal rate, regular rhythm. Good peripheral circulation. Grossly normal heart sounds.   Respiratory: Normal respiratory effort.  No retractions. Lungs CTAB. Gastrointestinal: Soft and nontender. No distention.  Musculoskeletal: No  lower extremity tenderness nor edema. No gross deformities of extremities. Neurologic:  Normal speech and language. No gross focal neurologic deficits are appreciated. No altered mental status, able to give full seemingly accurate history.  Face is symmetric, EOM's intact, pupils equal and reactive, vision intact, tongue and uvula midline without deviation. Upper and Lower extremity motor 5/5, intact pain perception in distal extremities, 2+ reflexes in biceps, patella and achilles tendons. Able to perform finger to nose normal with both hands. Walks without assistance  or evident ataxia.  Skin:  Skin is warm, dry and intact. No rash noted.  ____________________________________________   LABS (all labs ordered are listed, but only abnormal results are displayed)  Labs Reviewed  BASIC METABOLIC PANEL - Abnormal; Notable for the following components:      Result Value   Potassium 3.1 (*)    Glucose, Bld 124 (*)    All other components within normal limits  CBC WITH DIFFERENTIAL/PLATELET - Abnormal; Notable for the following components:   Hemoglobin 11.7 (*)    All other components within normal limits  MAGNESIUM - Abnormal; Notable for the following components:   Magnesium 1.5 (*)    All other components within normal limits  TROPONIN I  CBG MONITORING, ED  CBG MONITORING, ED   ____________________________________________  EKG   EKG Interpretation  Date/Time:  Thursday February 25 2018 09:15:33 EDT Ventricular Rate:  57 PR Interval:    QRS Duration: 92 QT Interval:  472 QTC Calculation: 460 R Axis:   104 Text Interpretation:  Sinus rhythm Right axis deviation Low voltage, precordial leads Borderline repolarization abnormality Baseline wander in lead(s) V5 no significant change since november 2017 Confirmed by Marily Memos [phone]) on 02/25/2018 10:53:49 AM       ____________________________________________  RADIOLOGY  No results found.  ____________________________________________   PROCEDURES  Procedure(s) performed:   Procedures   ____________________________________________   INITIAL IMPRESSION / ASSESSMENT AND PLAN / ED COURSE  Suspect dehydration/le blood pooling as likely etiology for her syncope. Will give fluids, check basic labs and likely discharge to pcp follow up.  Found to have mild hypokalemia and hypomagnesemia however I think based on normal EKG that these do not attribute to her syncope.  She did not have any arrhythmias while she was in the emergency room.  Still feel like there is more related to  dehydration and just standing for long period of time.  She is able to ambulate after the fluids without difficulty.  She will continue taking potassium and magnesium at home and follow-up with her primary care doctor.  Pertinent labs & imaging results that were available during my care of the patient were reviewed by me and considered in my medical decision making (see chart for details).  ____________________________________________  FINAL CLINICAL IMPRESSION(S) / ED DIAGNOSES  Final diagnoses:  Near syncope  Syncope and collapse  Hypomagnesemia  Hypokalemia     MEDICATIONS GIVEN DURING THIS VISIT:  Medications  potassium chloride 10 mEq in 100 mL IVPB (10 mEq Intravenous Not Given 02/25/18 1430)  lactated ringers bolus 1,000 mL (0 mLs Intravenous Stopped 02/25/18 1303)  magnesium sulfate IVPB 2 g 50 mL (0 g Intravenous Stopped 02/25/18 1303)  potassium chloride SA (K-DUR,KLOR-CON) CR tablet 40 mEq (40 mEq Oral Given 02/25/18 1127)     NEW OUTPATIENT MEDICATIONS STARTED DURING THIS VISIT:  New Prescriptions   MAGNESIUM GLUCONATE (MAGONATE) 30 MG TABLET    Take 1 tablet (30 mg total) by mouth 2 (two) times daily.  POTASSIUM CHLORIDE SA (K-DUR,KLOR-CON) 20 MEQ TABLET    Take 2 tablets (40 mEq total) by mouth 2 (two) times daily.    Note:  This note was prepared with assistance of Dragon voice recognition software. Occasional wrong-word or sound-a-like substitutions may have occurred due to the inherent limitations of voice recognition software.   Marily MemosMesner, Faith Branan, MD 02/25/18 1441

## 2018-02-25 NOTE — ED Notes (Signed)
Patient ambulated with no symptoms of dizziness, lightheadedness. Pt maintained HR of 68 and O2 of 100%.

## 2018-04-06 DIAGNOSIS — M129 Arthropathy, unspecified: Secondary | ICD-10-CM | POA: Diagnosis not present

## 2018-04-06 DIAGNOSIS — I10 Essential (primary) hypertension: Secondary | ICD-10-CM | POA: Diagnosis not present

## 2018-04-06 DIAGNOSIS — E785 Hyperlipidemia, unspecified: Secondary | ICD-10-CM | POA: Diagnosis not present

## 2018-04-15 DIAGNOSIS — Z86711 Personal history of pulmonary embolism: Secondary | ICD-10-CM | POA: Diagnosis not present

## 2018-04-15 DIAGNOSIS — Z7982 Long term (current) use of aspirin: Secondary | ICD-10-CM | POA: Diagnosis not present

## 2018-04-15 DIAGNOSIS — N3281 Overactive bladder: Secondary | ICD-10-CM | POA: Diagnosis not present

## 2018-04-15 DIAGNOSIS — M129 Arthropathy, unspecified: Secondary | ICD-10-CM | POA: Diagnosis not present

## 2018-04-15 DIAGNOSIS — G43109 Migraine with aura, not intractable, without status migrainosus: Secondary | ICD-10-CM | POA: Diagnosis not present

## 2018-04-16 DIAGNOSIS — Z86711 Personal history of pulmonary embolism: Secondary | ICD-10-CM | POA: Diagnosis not present

## 2018-04-16 DIAGNOSIS — Z7982 Long term (current) use of aspirin: Secondary | ICD-10-CM | POA: Diagnosis not present

## 2018-04-16 DIAGNOSIS — M129 Arthropathy, unspecified: Secondary | ICD-10-CM | POA: Diagnosis not present

## 2018-04-16 DIAGNOSIS — N3281 Overactive bladder: Secondary | ICD-10-CM | POA: Diagnosis not present

## 2018-04-16 DIAGNOSIS — G43109 Migraine with aura, not intractable, without status migrainosus: Secondary | ICD-10-CM | POA: Diagnosis not present

## 2018-04-19 DIAGNOSIS — Z7982 Long term (current) use of aspirin: Secondary | ICD-10-CM | POA: Diagnosis not present

## 2018-04-19 DIAGNOSIS — G43109 Migraine with aura, not intractable, without status migrainosus: Secondary | ICD-10-CM | POA: Diagnosis not present

## 2018-04-19 DIAGNOSIS — N3281 Overactive bladder: Secondary | ICD-10-CM | POA: Diagnosis not present

## 2018-04-19 DIAGNOSIS — M129 Arthropathy, unspecified: Secondary | ICD-10-CM | POA: Diagnosis not present

## 2018-04-19 DIAGNOSIS — Z86711 Personal history of pulmonary embolism: Secondary | ICD-10-CM | POA: Diagnosis not present

## 2018-04-20 DIAGNOSIS — G43109 Migraine with aura, not intractable, without status migrainosus: Secondary | ICD-10-CM | POA: Diagnosis not present

## 2018-04-20 DIAGNOSIS — M129 Arthropathy, unspecified: Secondary | ICD-10-CM | POA: Diagnosis not present

## 2018-04-20 DIAGNOSIS — Z86711 Personal history of pulmonary embolism: Secondary | ICD-10-CM | POA: Diagnosis not present

## 2018-04-20 DIAGNOSIS — N3281 Overactive bladder: Secondary | ICD-10-CM | POA: Diagnosis not present

## 2018-04-20 DIAGNOSIS — Z7982 Long term (current) use of aspirin: Secondary | ICD-10-CM | POA: Diagnosis not present

## 2018-04-23 DIAGNOSIS — G43109 Migraine with aura, not intractable, without status migrainosus: Secondary | ICD-10-CM | POA: Diagnosis not present

## 2018-04-23 DIAGNOSIS — Z7982 Long term (current) use of aspirin: Secondary | ICD-10-CM | POA: Diagnosis not present

## 2018-04-23 DIAGNOSIS — N3281 Overactive bladder: Secondary | ICD-10-CM | POA: Diagnosis not present

## 2018-04-23 DIAGNOSIS — M129 Arthropathy, unspecified: Secondary | ICD-10-CM | POA: Diagnosis not present

## 2018-04-23 DIAGNOSIS — Z86711 Personal history of pulmonary embolism: Secondary | ICD-10-CM | POA: Diagnosis not present

## 2018-04-26 DIAGNOSIS — Z86711 Personal history of pulmonary embolism: Secondary | ICD-10-CM | POA: Diagnosis not present

## 2018-04-26 DIAGNOSIS — M129 Arthropathy, unspecified: Secondary | ICD-10-CM | POA: Diagnosis not present

## 2018-04-26 DIAGNOSIS — N3281 Overactive bladder: Secondary | ICD-10-CM | POA: Diagnosis not present

## 2018-04-26 DIAGNOSIS — G43109 Migraine with aura, not intractable, without status migrainosus: Secondary | ICD-10-CM | POA: Diagnosis not present

## 2018-04-26 DIAGNOSIS — Z7982 Long term (current) use of aspirin: Secondary | ICD-10-CM | POA: Diagnosis not present

## 2018-04-28 DIAGNOSIS — M129 Arthropathy, unspecified: Secondary | ICD-10-CM | POA: Diagnosis not present

## 2018-04-28 DIAGNOSIS — Z86711 Personal history of pulmonary embolism: Secondary | ICD-10-CM | POA: Diagnosis not present

## 2018-04-28 DIAGNOSIS — Z7982 Long term (current) use of aspirin: Secondary | ICD-10-CM | POA: Diagnosis not present

## 2018-04-28 DIAGNOSIS — G43109 Migraine with aura, not intractable, without status migrainosus: Secondary | ICD-10-CM | POA: Diagnosis not present

## 2018-04-28 DIAGNOSIS — N3281 Overactive bladder: Secondary | ICD-10-CM | POA: Diagnosis not present

## 2018-07-06 DIAGNOSIS — H5213 Myopia, bilateral: Secondary | ICD-10-CM | POA: Diagnosis not present

## 2018-07-07 ENCOUNTER — Other Ambulatory Visit (HOSPITAL_COMMUNITY): Payer: Self-pay | Admitting: Pulmonary Disease

## 2018-07-07 ENCOUNTER — Ambulatory Visit (HOSPITAL_COMMUNITY)
Admission: RE | Admit: 2018-07-07 | Discharge: 2018-07-07 | Disposition: A | Discharge status: Home / Self Care | Payer: Medicare Other | Source: Ambulatory Visit | Attending: Pulmonary Disease | Admitting: Pulmonary Disease

## 2018-07-07 DIAGNOSIS — M129 Arthropathy, unspecified: Secondary | ICD-10-CM | POA: Diagnosis not present

## 2018-07-07 DIAGNOSIS — I82491 Acute embolism and thrombosis of other specified deep vein of right lower extremity: Secondary | ICD-10-CM | POA: Diagnosis not present

## 2018-07-07 DIAGNOSIS — M79604 Pain in right leg: Secondary | ICD-10-CM

## 2018-07-07 DIAGNOSIS — I82441 Acute embolism and thrombosis of right tibial vein: Secondary | ICD-10-CM | POA: Diagnosis not present

## 2018-07-07 DIAGNOSIS — R0602 Shortness of breath: Secondary | ICD-10-CM | POA: Diagnosis not present

## 2018-07-07 DIAGNOSIS — Z23 Encounter for immunization: Secondary | ICD-10-CM | POA: Diagnosis not present

## 2018-07-08 ENCOUNTER — Ambulatory Visit (HOSPITAL_COMMUNITY): Payer: Medicare Other

## 2018-07-28 DIAGNOSIS — H524 Presbyopia: Secondary | ICD-10-CM | POA: Diagnosis not present

## 2018-07-28 DIAGNOSIS — H5211 Myopia, right eye: Secondary | ICD-10-CM | POA: Diagnosis not present

## 2018-07-28 DIAGNOSIS — H52223 Regular astigmatism, bilateral: Secondary | ICD-10-CM | POA: Diagnosis not present

## 2018-07-28 DIAGNOSIS — H5202 Hypermetropia, left eye: Secondary | ICD-10-CM | POA: Diagnosis not present

## 2018-08-11 DIAGNOSIS — Z23 Encounter for immunization: Secondary | ICD-10-CM | POA: Diagnosis not present

## 2018-08-11 DIAGNOSIS — I82401 Acute embolism and thrombosis of unspecified deep veins of right lower extremity: Secondary | ICD-10-CM | POA: Diagnosis not present

## 2018-08-11 DIAGNOSIS — I10 Essential (primary) hypertension: Secondary | ICD-10-CM | POA: Diagnosis not present

## 2018-11-08 DIAGNOSIS — I10 Essential (primary) hypertension: Secondary | ICD-10-CM | POA: Diagnosis not present

## 2018-11-08 DIAGNOSIS — I82401 Acute embolism and thrombosis of unspecified deep veins of right lower extremity: Secondary | ICD-10-CM | POA: Diagnosis not present

## 2018-11-08 DIAGNOSIS — E785 Hyperlipidemia, unspecified: Secondary | ICD-10-CM | POA: Diagnosis not present

## 2019-02-07 DIAGNOSIS — Z0000 Encounter for general adult medical examination without abnormal findings: Secondary | ICD-10-CM | POA: Diagnosis not present

## 2019-10-13 ENCOUNTER — Emergency Department (HOSPITAL_COMMUNITY): Payer: Medicare Other

## 2019-10-13 ENCOUNTER — Encounter (HOSPITAL_COMMUNITY): Payer: Self-pay

## 2019-10-13 ENCOUNTER — Other Ambulatory Visit: Payer: Self-pay

## 2019-10-13 ENCOUNTER — Emergency Department (HOSPITAL_COMMUNITY)
Admission: EM | Admit: 2019-10-13 | Discharge: 2019-10-13 | Disposition: A | Discharge status: Home / Self Care | Payer: Medicare Other | Attending: Emergency Medicine | Admitting: Emergency Medicine

## 2019-10-13 DIAGNOSIS — Z79899 Other long term (current) drug therapy: Secondary | ICD-10-CM | POA: Insufficient documentation

## 2019-10-13 DIAGNOSIS — Y929 Unspecified place or not applicable: Secondary | ICD-10-CM | POA: Diagnosis not present

## 2019-10-13 DIAGNOSIS — S8992XA Unspecified injury of left lower leg, initial encounter: Secondary | ICD-10-CM | POA: Diagnosis present

## 2019-10-13 DIAGNOSIS — W19XXXA Unspecified fall, initial encounter: Secondary | ICD-10-CM | POA: Insufficient documentation

## 2019-10-13 DIAGNOSIS — Y939 Activity, unspecified: Secondary | ICD-10-CM | POA: Diagnosis not present

## 2019-10-13 DIAGNOSIS — Z743 Need for continuous supervision: Secondary | ICD-10-CM | POA: Diagnosis not present

## 2019-10-13 DIAGNOSIS — Z7982 Long term (current) use of aspirin: Secondary | ICD-10-CM | POA: Diagnosis not present

## 2019-10-13 DIAGNOSIS — R0902 Hypoxemia: Secondary | ICD-10-CM | POA: Diagnosis not present

## 2019-10-13 DIAGNOSIS — S82035A Nondisplaced transverse fracture of left patella, initial encounter for closed fracture: Secondary | ICD-10-CM

## 2019-10-13 DIAGNOSIS — R609 Edema, unspecified: Secondary | ICD-10-CM | POA: Diagnosis not present

## 2019-10-13 DIAGNOSIS — Y999 Unspecified external cause status: Secondary | ICD-10-CM | POA: Insufficient documentation

## 2019-10-13 DIAGNOSIS — M25562 Pain in left knee: Secondary | ICD-10-CM | POA: Diagnosis not present

## 2019-10-13 DIAGNOSIS — S82092A Other fracture of left patella, initial encounter for closed fracture: Secondary | ICD-10-CM | POA: Diagnosis not present

## 2019-10-13 MED ORDER — TRAMADOL HCL 50 MG PO TABS
100.0000 mg | ORAL_TABLET | Freq: Once | ORAL | Status: AC
  Administered 2019-10-13: 100 mg via ORAL
  Filled 2019-10-13: qty 2

## 2019-10-13 MED ORDER — TRAMADOL HCL 50 MG PO TABS: 50.0000 mg | ORAL_TABLET | Freq: Four times a day (QID) | ORAL | 0 refills | Status: DC | PRN

## 2019-10-13 NOTE — ED Triage Notes (Signed)
Pt presents to ED with complaints of left knee pain since Tuesday. Pt states she fell and hit her knee, pt denies LOC or hitting head.

## 2019-10-13 NOTE — ED Provider Notes (Signed)
[facility] EMERGENCY DEPARTMENT Provider Note   CSN: [id number] Arrival date & time: 10/13/19  3354     History   Chief Complaint Chief Complaint  Patient presents with  . Knee Pain    HPI [name] Long is a 78 y.o. female.     HPI   She presents for evaluation of left knee pain onset several days ago after a fall.  She has not been able to bear weight or walk on the left leg.  She denies any other injuries in the fall.  There are no other known modifying factors.  Past Medical History:  Diagnosis Date  . Biliary acute pancreatitis 2006   s/p ERCP with stent, sphincterotomy, stone extraction, subsequent stent removal  several weeks later  . Kidney anomaly, congenital    solitary left kidney  . PE (pulmonary embolism)   . Schizophrenia [facility])     Patient Active Problem List   Diagnosis Date Noted  . Screening for colon cancer 10/02/2011    Past Surgical History:  Procedure Laterality Date  . ABDOMINAL HYSTERECTOMY    . CHOLECYSTECTOMY    . UMBILICAL HERNIA REPAIR       OB History   No obstetric history on file.      Home Medications    Prior to Admission medications   Medication Sig Start Date End Date Taking? Authorizing Provider  ALPRAZolam Prudy Feeler) 0.5 MG tablet Take 0.25 mg by mouth 2 (two) times daily as needed for anxiety. Sleep/Nerves    [provider]  aspirin EC 81 MG tablet Take 81 mg by mouth daily.    [provider]  atorvastatin (LIPITOR) 20 MG tablet 1 tablet daily. 06/27/16   [provider]  magnesium gluconate (MAGONATE) 30 MG tablet Take 1 tablet (30 mg total) by mouth 2 (two) times daily. 02/25/18   Mesner, Barbara Cower, MD  metoprolol tartrate (LOPRESSOR) 25 MG tablet Take 25 mg by mouth 2 (two) times daily.     [provider]  potassium chloride SA (K-DUR,KLOR-CON) 20 MEQ tablet Take 2 tablets (40 mEq total) by mouth 2 (two) times daily. 02/25/18   Mesner, Barbara Cower, MD  risperiDONE (RISPERDAL) 0.5 MG tablet 1  tablet 2 (two) times daily. 06/27/16   [provider]  traMADol (ULTRAM) 50 MG tablet Take 1 tablet (50 mg total) by mouth every 6 (six) hours as needed. 10/13/19   Mancel Bale, MD    Family History Family History  Problem Relation Age of Onset  . Colon cancer Other        Mother, Brother, unsure age    Social History Social History   Tobacco Use  . Smoking status: Never Smoker  . Smokeless tobacco: Never Used  Substance Use Topics  . Alcohol use: No  . Drug use: No     Allergies   Patient has no known allergies.   Review of Systems Review of Systems  All other systems reviewed and are negative.    Physical Exam Updated Vital Signs BP (!) 144/66 (BP Location: Left Arm)   Pulse 75   Temp 98.2 F (36.8 C) (Oral)   Resp 18   Ht 5\' 2"  (1.575 m)   Wt 49.9 kg   SpO2 99%   BMI 20.12 kg/m   Physical Exam Vitals signs and nursing note reviewed.  Constitutional:      General: She is not in acute distress.    Appearance: She is well-developed. She is not ill-appearing, toxic-appearing or diaphoretic.  HENT:     Head: Normocephalic and atraumatic.     Right Ear: External ear normal.     Left Ear: External ear normal.  Eyes:     Conjunctiva/sclera: Conjunctivae normal.     Pupils: Pupils are equal, round, and reactive to light.  Neck:     Musculoskeletal: Normal range of motion and neck supple.     Trachea: Phonation normal.  Cardiovascular:     Rate and Rhythm: Normal rate.  Pulmonary:     Effort: Pulmonary effort is normal.  Musculoskeletal:     Comments: Left knee tender and swollen over patella.  Unable to straight leg raise, secondary to pain and possible disability.  Neurovascular intact distally in the left foot.  No tenderness of the left hip or left ankle.  Skin:    General: Skin is warm and dry.  Neurological:     Mental Status: She is alert and oriented to person, place, and time.     Cranial Nerves: No cranial nerve deficit.      Sensory: No sensory deficit.     Motor: No abnormal muscle tone.     Coordination: Coordination normal.  Psychiatric:        Mood and Affect: Mood normal.        Behavior: Behavior normal.        Thought Content: Thought content normal.        Judgment: Judgment normal.      ED Treatments / Results  Labs (all labs ordered are listed, but only abnormal results are displayed) Labs Reviewed - No data to display  EKG None  Radiology Dg Knee Complete 4 Views Left  Result Date: 10/13/2019 CLINICAL DATA:  LEFT knee pain fall. EXAM: LEFT KNEE - COMPLETE 4+ VIEW COMPARISON:  None. FINDINGS: There is a horizontal fracture through the mid patella. Suprapatellar joint effusion noted. No fracture of the tibial plateau. Femur fracture IMPRESSION: Horizontal fracture through the patella. Joint effusion Electronically Signed   By: Genevive BiStewart  Edmunds M.D.   On: 10/13/2019 07:48    Procedures Procedures (including critical care time)  Medications Ordered in ED Medications  traMADol (ULTRAM) tablet 100 mg (has no administration in time range)     Initial Impression / Assessment and Plan / ED Course  I have reviewed the triage vital signs and the nursing notes.  Pertinent labs & imaging results that were available during my care of the patient were reviewed by me and considered in my medical decision making (see chart for details).  Clinical Course as of Oct 12 828  Thu Oct 13, 2019  0805 Mid patellar fracture, minimally displaced.  Interpreted by me.  DG Knee Complete 4 Views Left [EW]    Clinical Course User Index [EW] Mancel BaleWentz, Jalyne Brodzinski, MD        Patient Vitals for the past 24 hrs:  BP Temp Temp src Pulse Resp SpO2 Height Weight  10/13/19 0639 - - - - - - 5\' 2"  (1.575 m) 49.9 kg  10/13/19 0638 (!) 144/66 98.2 F (36.8 C) Oral 75 18 99 % - -    8:04 AM Reevaluation with update and discussion. After initial assessment and treatment, an updated evaluation reveals no change in  clinical status.  ED treatment Ace wrap and immobilizer with crutches.  Findings discussed and questions answered. Mancel BaleElliott Areta Terwilliger   Medical Decision Making: Subacute left patellar fracture, minimally displaced, this will require surgical repair for stability and healing.  Currently, the fracture is amenable to  treatment with knee immobilizer, nonweightbearing on crutches.  No indication for hospitalization at this time.  CRITICAL CARE-no Performed by: Daleen Bo  Nursing Notes Reviewed/ Care Coordinated Applicable Imaging Reviewed Interpretation of Laboratory Data incorporated into ED treatment  The patient appears reasonably screened and/or stabilized for discharge and I doubt any other medical condition or other [facility] requiring further screening, evaluation, or treatment in the ED at this time prior to discharge.  Plan: Home Medications-continue usual; Home Treatments-knee immobilizer whenever standing; return here if the recommended treatment, does not improve the symptoms; Recommended follow up-orthopedic follow-up for surgical repair.    Final Clinical Impressions(s) / ED Diagnoses   Final diagnoses:  Closed nondisplaced transverse fracture of left patella, initial encounter    ED Discharge Orders         Ordered    traMADol (ULTRAM) 50 MG tablet  Every 6 hours PRN     10/13/19 0809           Daleen Bo, MD 10/13/19 [phone]

## 2019-10-13 NOTE — Discharge Instructions (Signed)
Wear the Ace wrap and immobilizer on your knee whenever you get up.  Call the orthopedic doctor for a follow-up appointment to be seen as soon as possible.

## 2019-10-20 DIAGNOSIS — S82032A Displaced transverse fracture of left patella, initial encounter for closed fracture: Secondary | ICD-10-CM | POA: Diagnosis not present

## 2019-10-21 ENCOUNTER — Other Ambulatory Visit (HOSPITAL_COMMUNITY)
Admission: RE | Admit: 2019-10-21 | Discharge: 2019-10-21 | Disposition: A | Discharge status: Home / Self Care | Payer: Medicare Other | Source: Ambulatory Visit | Attending: Orthopedic Surgery | Admitting: Orthopedic Surgery

## 2019-10-21 ENCOUNTER — Other Ambulatory Visit: Payer: Self-pay

## 2019-10-21 DIAGNOSIS — Z01812 Encounter for preprocedural laboratory examination: Secondary | ICD-10-CM | POA: Diagnosis not present

## 2019-10-21 DIAGNOSIS — Z20828 Contact with and (suspected) exposure to other viral communicable diseases: Secondary | ICD-10-CM | POA: Diagnosis not present

## 2019-10-21 LAB — SARS CORONAVIRUS 2 (TAT 6-24 HRS): SARS Coronavirus 2: NEGATIVE

## 2019-10-24 ENCOUNTER — Encounter (HOSPITAL_COMMUNITY): Payer: Self-pay | Admitting: *Deleted

## 2019-10-24 ENCOUNTER — Other Ambulatory Visit: Payer: Self-pay

## 2019-10-24 NOTE — Progress Notes (Signed)
Spoke with pt for pre-op call. Pt states she had a heart attack a long time ago, did not have a cath. Pt denies any recent chest pain or sob. Pt denies being diabetic.  Pt will be bringing her medications with her, the pharmacy tech tried to get her list, but pt wasn't sure what meds she was taking. I asked pt is she was still taking Metoprolol, but she wasn't sure. I instructed her not to take any medications in the AM, but to bring medications with her. She voiced understanding.  Pt had her Covid test on Friday,  10/21/19 and it's negative. Pt states she has been in quarantine since the test was done.

## 2019-10-24 NOTE — Progress Notes (Signed)
Called pt again because Dr. Stann Mainland has put orders in and he ordered ERAS for pt. Pt unable to come and get a Pre-Surgery Ensure, but I instructed her to not eat food after midnight, but to drink clear liquids until 11:15 AM (3 hours prior to surgery). I reviewed with her what clear liquids are and she voiced understanding.

## 2019-10-25 ENCOUNTER — Ambulatory Visit (HOSPITAL_COMMUNITY): Payer: Medicare Other | Admitting: Certified Registered Nurse Anesthetist

## 2019-10-25 ENCOUNTER — Observation Stay (HOSPITAL_COMMUNITY)
Admission: RE | Admit: 2019-10-25 | Discharge: 2019-10-27 | Disposition: A | Discharge status: Home / Self Care | Payer: Medicare Other | Source: Home / Self Care | Attending: Orthopedic Surgery | Admitting: Orthopedic Surgery

## 2019-10-25 ENCOUNTER — Encounter (HOSPITAL_COMMUNITY): Payer: Self-pay | Admitting: *Deleted

## 2019-10-25 ENCOUNTER — Ambulatory Visit (HOSPITAL_COMMUNITY): Payer: Medicare Other

## 2019-10-25 ENCOUNTER — Encounter (HOSPITAL_COMMUNITY)
Admission: RE | Disposition: A | Discharge status: Home / Self Care | Payer: Self-pay | Source: Home / Self Care | Attending: Orthopedic Surgery

## 2019-10-25 DIAGNOSIS — R5381 Other malaise: Secondary | ICD-10-CM | POA: Diagnosis not present

## 2019-10-25 DIAGNOSIS — R4182 Altered mental status, unspecified: Secondary | ICD-10-CM | POA: Diagnosis not present

## 2019-10-25 DIAGNOSIS — S82002A Unspecified fracture of left patella, initial encounter for closed fracture: Secondary | ICD-10-CM | POA: Diagnosis not present

## 2019-10-25 DIAGNOSIS — Q600 Renal agenesis, unilateral: Secondary | ICD-10-CM | POA: Insufficient documentation

## 2019-10-25 DIAGNOSIS — E872 Acidosis: Secondary | ICD-10-CM | POA: Diagnosis not present

## 2019-10-25 DIAGNOSIS — N179 Acute kidney failure, unspecified: Secondary | ICD-10-CM | POA: Diagnosis not present

## 2019-10-25 DIAGNOSIS — W19XXXA Unspecified fall, initial encounter: Secondary | ICD-10-CM | POA: Diagnosis not present

## 2019-10-25 DIAGNOSIS — Z79899 Other long term (current) drug therapy: Secondary | ICD-10-CM | POA: Insufficient documentation

## 2019-10-25 DIAGNOSIS — I739 Peripheral vascular disease, unspecified: Secondary | ICD-10-CM | POA: Insufficient documentation

## 2019-10-25 DIAGNOSIS — B9620 Unspecified Escherichia coli [E. coli] as the cause of diseases classified elsewhere: Secondary | ICD-10-CM | POA: Diagnosis not present

## 2019-10-25 DIAGNOSIS — N390 Urinary tract infection, site not specified: Secondary | ICD-10-CM | POA: Diagnosis not present

## 2019-10-25 DIAGNOSIS — Z7901 Long term (current) use of anticoagulants: Secondary | ICD-10-CM | POA: Insufficient documentation

## 2019-10-25 DIAGNOSIS — X58XXXA Exposure to other specified factors, initial encounter: Secondary | ICD-10-CM | POA: Insufficient documentation

## 2019-10-25 DIAGNOSIS — S82032A Displaced transverse fracture of left patella, initial encounter for closed fracture: Secondary | ICD-10-CM | POA: Insufficient documentation

## 2019-10-25 DIAGNOSIS — M1990 Unspecified osteoarthritis, unspecified site: Secondary | ICD-10-CM | POA: Diagnosis not present

## 2019-10-25 DIAGNOSIS — I252 Old myocardial infarction: Secondary | ICD-10-CM | POA: Insufficient documentation

## 2019-10-25 DIAGNOSIS — Z86711 Personal history of pulmonary embolism: Secondary | ICD-10-CM | POA: Insufficient documentation

## 2019-10-25 DIAGNOSIS — S82032D Displaced transverse fracture of left patella, subsequent encounter for closed fracture with routine healing: Secondary | ICD-10-CM | POA: Diagnosis not present

## 2019-10-25 DIAGNOSIS — R531 Weakness: Secondary | ICD-10-CM | POA: Diagnosis not present

## 2019-10-25 DIAGNOSIS — R2681 Unsteadiness on feet: Secondary | ICD-10-CM | POA: Insufficient documentation

## 2019-10-25 DIAGNOSIS — Z86718 Personal history of other venous thrombosis and embolism: Secondary | ICD-10-CM | POA: Diagnosis not present

## 2019-10-25 DIAGNOSIS — S82002D Unspecified fracture of left patella, subsequent encounter for closed fracture with routine healing: Secondary | ICD-10-CM | POA: Diagnosis not present

## 2019-10-25 DIAGNOSIS — R0602 Shortness of breath: Secondary | ICD-10-CM | POA: Diagnosis not present

## 2019-10-25 DIAGNOSIS — R404 Transient alteration of awareness: Secondary | ICD-10-CM | POA: Diagnosis not present

## 2019-10-25 DIAGNOSIS — Z743 Need for continuous supervision: Secondary | ICD-10-CM | POA: Diagnosis not present

## 2019-10-25 DIAGNOSIS — I471 Supraventricular tachycardia: Secondary | ICD-10-CM | POA: Diagnosis not present

## 2019-10-25 DIAGNOSIS — Z419 Encounter for procedure for purposes other than remedying health state, unspecified: Secondary | ICD-10-CM

## 2019-10-25 DIAGNOSIS — U071 COVID-19: Secondary | ICD-10-CM | POA: Diagnosis not present

## 2019-10-25 DIAGNOSIS — I10 Essential (primary) hypertension: Secondary | ICD-10-CM | POA: Insufficient documentation

## 2019-10-25 DIAGNOSIS — R410 Disorientation, unspecified: Secondary | ICD-10-CM | POA: Diagnosis not present

## 2019-10-25 DIAGNOSIS — D649 Anemia, unspecified: Secondary | ICD-10-CM | POA: Insufficient documentation

## 2019-10-25 DIAGNOSIS — R52 Pain, unspecified: Secondary | ICD-10-CM | POA: Diagnosis not present

## 2019-10-25 DIAGNOSIS — I959 Hypotension, unspecified: Secondary | ICD-10-CM | POA: Diagnosis not present

## 2019-10-25 DIAGNOSIS — R2689 Other abnormalities of gait and mobility: Secondary | ICD-10-CM | POA: Diagnosis not present

## 2019-10-25 HISTORY — DX: Unspecified osteoarthritis, unspecified site: M19.90

## 2019-10-25 HISTORY — DX: Essential (primary) hypertension: I10

## 2019-10-25 HISTORY — DX: Peripheral vascular disease, unspecified: I73.9

## 2019-10-25 HISTORY — DX: Anemia, unspecified: D64.9

## 2019-10-25 HISTORY — DX: Acute myocardial infarction, unspecified: I21.9

## 2019-10-25 HISTORY — PX: ORIF PATELLA: SHX5033

## 2019-10-25 LAB — BASIC METABOLIC PANEL
Anion gap: 12 (ref 5–15)
BUN: 14 mg/dL (ref 8–23)
CO2: 23 mmol/L (ref 22–32)
Calcium: 8.9 mg/dL (ref 8.9–10.3)
Chloride: 102 mmol/L (ref 98–111)
Creatinine, Ser: 0.84 mg/dL (ref 0.44–1.00)
GFR calc Af Amer: >60 mL/min (ref >60)
GFR calc non Af Amer: >60 mL/min (ref >60)
Glucose, Bld: 114 mg/dL — ABNORMAL HIGH (ref 70–99)
Potassium: 3.6 mmol/L (ref 3.5–5.1)
Sodium: 137 mmol/L (ref 135–145)

## 2019-10-25 LAB — CBC
HCT: 41.2 % (ref 36.0–46.0)
Hemoglobin: 13.2 g/dL (ref 12.0–15.0)
MCH: 30.7 pg (ref 26.0–34.0)
MCHC: 32 g/dL (ref 30.0–36.0)
MCV: 95.8 fL (ref 80.0–100.0)
Platelets: 184 10*3/uL (ref 150–400)
RBC: 4.3 MIL/uL (ref 3.87–5.11)
RDW: 11.9 % (ref 11.5–15.5)
WBC: 4.9 10*3/uL (ref 4.0–10.5)
nRBC: 0 % (ref 0.0–0.2)

## 2019-10-25 LAB — PROTIME-INR
INR: 1 (ref 0.8–1.2)
Prothrombin Time: 13.5 seconds (ref 11.4–15.2)

## 2019-10-25 SURGERY — OPEN REDUCTION INTERNAL FIXATION (ORIF) PATELLA
Anesthesia: Monitor Anesthesia Care | Site: Knee | Laterality: Left

## 2019-10-25 MED ORDER — PHENYLEPHRINE HCL-NACL 10-0.9 MG/250ML-% IV SOLN
INTRAVENOUS | Status: DC | PRN
  Administered 2019-10-25: 30 ug/min via INTRAVENOUS

## 2019-10-25 MED ORDER — HYDROCODONE-ACETAMINOPHEN 5-325 MG PO TABS: 1.0000 | ORAL_TABLET | ORAL | Status: DC | PRN

## 2019-10-25 MED ORDER — CHLORHEXIDINE GLUCONATE 4 % EX LIQD: 60.0000 mL | Freq: Once | CUTANEOUS | Status: DC

## 2019-10-25 MED ORDER — ENOXAPARIN SODIUM 30 MG/0.3ML ~~LOC~~ SOLN
30.0000 mg | SUBCUTANEOUS | Status: DC
  Administered 2019-10-26 – 2019-10-27 (×2): 30 mg via SUBCUTANEOUS
  Filled 2019-10-25 (×2): qty 0.3

## 2019-10-25 MED ORDER — LACTATED RINGERS IV SOLN
INTRAVENOUS | Status: DC
  Administered 2019-10-25 (×3): via INTRAVENOUS

## 2019-10-25 MED ORDER — FENTANYL CITRATE (PF) 100 MCG/2ML IJ SOLN
INTRAMUSCULAR | Status: DC | PRN
  Administered 2019-10-25: 50 ug via INTRAVENOUS

## 2019-10-25 MED ORDER — TRAMADOL HCL 50 MG PO TABS
50.0000 mg | ORAL_TABLET | Freq: Four times a day (QID) | ORAL | Status: DC | PRN
  Administered 2019-10-26 – 2019-10-27 (×2): 50 mg via ORAL
  Filled 2019-10-25 (×2): qty 1

## 2019-10-25 MED ORDER — ONDANSETRON HCL 4 MG/2ML IJ SOLN
INTRAMUSCULAR | Status: DC | PRN
  Administered 2019-10-25: 4 mg via INTRAVENOUS

## 2019-10-25 MED ORDER — MEPIVACAINE HCL (PF) 2 % IJ SOLN
INTRAMUSCULAR | Status: DC | PRN
  Administered 2019-10-25: 2.8 mL via EPIDURAL

## 2019-10-25 MED ORDER — ONDANSETRON HCL 4 MG PO TABS: 4.0000 mg | ORAL_TABLET | Freq: Four times a day (QID) | ORAL | Status: DC | PRN

## 2019-10-25 MED ORDER — 0.9 % SODIUM CHLORIDE (POUR BTL) OPTIME
TOPICAL | Status: DC | PRN
  Administered 2019-10-25: 1000 mL

## 2019-10-25 MED ORDER — MORPHINE SULFATE (PF) 2 MG/ML IV SOLN: 0.5000 mg | INTRAVENOUS | Status: DC | PRN

## 2019-10-25 MED ORDER — FENTANYL CITRATE (PF) 250 MCG/5ML IJ SOLN
INTRAMUSCULAR | Status: AC
  Filled 2019-10-25: qty 5

## 2019-10-25 MED ORDER — PROPOFOL 500 MG/50ML IV EMUL
INTRAVENOUS | Status: DC | PRN
  Administered 2019-10-25: 100 ug/kg/min via INTRAVENOUS

## 2019-10-25 MED ORDER — METOPROLOL TARTRATE 25 MG PO TABS
12.5000 mg | ORAL_TABLET | Freq: Two times a day (BID) | ORAL | Status: DC
  Administered 2019-10-25 – 2019-10-27 (×3): 12.5 mg via ORAL
  Filled 2019-10-25 (×4): qty 1

## 2019-10-25 MED ORDER — ONDANSETRON HCL 4 MG/2ML IJ SOLN: 4.0000 mg | Freq: Once | INTRAMUSCULAR | Status: DC | PRN

## 2019-10-25 MED ORDER — ONDANSETRON HCL 4 MG/2ML IJ SOLN: 4.0000 mg | Freq: Four times a day (QID) | INTRAMUSCULAR | Status: DC | PRN

## 2019-10-25 MED ORDER — BUPIVACAINE HCL (PF) 0.25 % IJ SOLN
INTRAMUSCULAR | Status: AC
  Filled 2019-10-25: qty 30

## 2019-10-25 MED ORDER — MIDAZOLAM HCL 2 MG/2ML IJ SOLN
INTRAMUSCULAR | Status: AC
  Filled 2019-10-25: qty 2

## 2019-10-25 MED ORDER — FENTANYL CITRATE (PF) 100 MCG/2ML IJ SOLN
INTRAMUSCULAR | Status: AC
  Administered 2019-10-25: 50 ug via INTRAVENOUS
  Filled 2019-10-25: qty 2

## 2019-10-25 MED ORDER — DEXAMETHASONE SODIUM PHOSPHATE 10 MG/ML IJ SOLN
INTRAMUSCULAR | Status: DC | PRN
  Administered 2019-10-25: 10 mg

## 2019-10-25 MED ORDER — ROPIVACAINE HCL 5 MG/ML IJ SOLN
INTRAMUSCULAR | Status: DC | PRN
  Administered 2019-10-25: 20 mL via PERINEURAL

## 2019-10-25 MED ORDER — ACETAMINOPHEN 325 MG PO TABS: 325.0000 mg | ORAL_TABLET | Freq: Four times a day (QID) | ORAL | Status: DC | PRN

## 2019-10-25 MED ORDER — FENTANYL CITRATE (PF) 100 MCG/2ML IJ SOLN: 25.0000 ug | INTRAMUSCULAR | Status: DC | PRN

## 2019-10-25 MED ORDER — FENTANYL CITRATE (PF) 100 MCG/2ML IJ SOLN
50.0000 ug | Freq: Once | INTRAMUSCULAR | Status: AC
  Administered 2019-10-25: 13:00:00 50 ug via INTRAVENOUS

## 2019-10-25 MED ORDER — METHOCARBAMOL 500 MG PO TABS: 500.0000 mg | ORAL_TABLET | Freq: Four times a day (QID) | ORAL | Status: DC | PRN

## 2019-10-25 MED ORDER — ACETAMINOPHEN 500 MG PO TABS
500.0000 mg | ORAL_TABLET | Freq: Four times a day (QID) | ORAL | Status: AC
  Administered 2019-10-25 – 2019-10-26 (×3): 500 mg via ORAL
  Filled 2019-10-25 (×3): qty 1

## 2019-10-25 MED ORDER — METHOCARBAMOL 1000 MG/10ML IJ SOLN
500.0000 mg | Freq: Four times a day (QID) | INTRAVENOUS | Status: DC | PRN
  Filled 2019-10-25: qty 5

## 2019-10-25 MED ORDER — CEFAZOLIN SODIUM-DEXTROSE 2-4 GM/100ML-% IV SOLN
2.0000 g | INTRAVENOUS | Status: AC
  Administered 2019-10-25: 15:00:00 2 g via INTRAVENOUS

## 2019-10-25 MED ORDER — HYDROCODONE-ACETAMINOPHEN 7.5-325 MG PO TABS: 1.0000 | ORAL_TABLET | ORAL | Status: DC | PRN

## 2019-10-25 SURGICAL SUPPLY — 66 items
ALCOHOL 70% 16 OZ (MISCELLANEOUS) ×2 IMPLANT
BIT DRILL 2.6 CANN (BIT) ×1 IMPLANT
BNDG COHESIVE 4X5 TAN STRL (GAUZE/BANDAGES/DRESSINGS) IMPLANT
BNDG COHESIVE 6X5 TAN STRL LF (GAUZE/BANDAGES/DRESSINGS) ×2 IMPLANT
BNDG ELASTIC 4X5.8 VLCR STR LF (GAUZE/BANDAGES/DRESSINGS) IMPLANT
BNDG ELASTIC 6X5.8 VLCR STR LF (GAUZE/BANDAGES/DRESSINGS) ×1 IMPLANT
COVER SURGICAL LIGHT HANDLE (MISCELLANEOUS) ×2 IMPLANT
COVER WAND RF STERILE (DRAPES) ×2 IMPLANT
CUFF TOURN SGL QUICK 34 (TOURNIQUET CUFF)
CUFF TOURN SGL QUICK 42 (TOURNIQUET CUFF) IMPLANT
CUFF TRNQT CYL 34X4.125X (TOURNIQUET CUFF) IMPLANT
DRAPE C-ARM 42X72 X-RAY (DRAPES) ×1 IMPLANT
DRAPE C-ARMOR (DRAPES) ×1 IMPLANT
DRAPE IMP U-DRAPE 54X76 (DRAPES) ×2 IMPLANT
DRAPE INCISE IOBAN 66X45 STRL (DRAPES) ×2 IMPLANT
DRAPE U-SHAPE 47X51 STRL (DRAPES) IMPLANT
DRSG PAD ABDOMINAL 8X10 ST (GAUZE/BANDAGES/DRESSINGS) ×1 IMPLANT
DURAPREP 26ML APPLICATOR (WOUND CARE) ×2 IMPLANT
ELECT CAUTERY BLADE 6.4 (BLADE) ×2 IMPLANT
ELECT REM PT RETURN 9FT ADLT (ELECTROSURGICAL) ×2
ELECTRODE REM PT RTRN 9FT ADLT (ELECTROSURGICAL) ×1 IMPLANT
FIBERTAPE 2 W/STRL NDL 17 (SUTURE) ×1 IMPLANT
GAUZE SPONGE 4X4 12PLY STRL (GAUZE/BANDAGES/DRESSINGS) ×2 IMPLANT
GAUZE XEROFORM 5X9 LF (GAUZE/BANDAGES/DRESSINGS) IMPLANT
GLOVE BIO SURGEON STRL SZ7.5 (GLOVE) ×2 IMPLANT
GLOVE BIOGEL PI IND STRL 8 (GLOVE) ×2 IMPLANT
GLOVE BIOGEL PI INDICATOR 8 (GLOVE) ×2
GOWN STRL REUS W/ TWL LRG LVL3 (GOWN DISPOSABLE) ×1 IMPLANT
GOWN STRL REUS W/ TWL XL LVL3 (GOWN DISPOSABLE) ×1 IMPLANT
GOWN STRL REUS W/TWL LRG LVL3 (GOWN DISPOSABLE) ×2
GOWN STRL REUS W/TWL XL LVL3 (GOWN DISPOSABLE) ×2
GUIDEWIRE 1.35MM  DUAL TROCAR (WIRE) ×2
GUIDEWIRE 1.35MM DUAL TROCAR (WIRE) IMPLANT
KIT BASIN OR (CUSTOM PROCEDURE TRAY) ×2 IMPLANT
KIT TURNOVER KIT B (KITS) ×2 IMPLANT
NDL HYPO 25GX1X1/2 BEV (NEEDLE) IMPLANT
NEEDLE HYPO 25GX1X1/2 BEV (NEEDLE) IMPLANT
NS IRRIG 1000ML POUR BTL (IV SOLUTION) ×2 IMPLANT
PACK ORTHO EXTREMITY (CUSTOM PROCEDURE TRAY) ×2 IMPLANT
PAD ARMBOARD 7.5X6 YLW CONV (MISCELLANEOUS) ×4 IMPLANT
PAD CAST 3X4 CTTN HI CHSV (CAST SUPPLIES) IMPLANT
PADDING CAST COTTON 3X4 STRL (CAST SUPPLIES)
PADDING CAST COTTON 6X4 STRL (CAST SUPPLIES) ×1 IMPLANT
PASSER SUT SWANSON 36MM LOOP (INSTRUMENTS) IMPLANT
SCREW 4X36MM CANN LO-PRO (Screw) ×2 IMPLANT
SPONGE LAP 18X18 RF (DISPOSABLE) ×2 IMPLANT
STOCKINETTE IMPERVIOUS LG (DRAPES) ×2 IMPLANT
STRIP CLOSURE SKIN 1/2X4 (GAUZE/BANDAGES/DRESSINGS) ×1 IMPLANT
SUCTION FRAZIER HANDLE 10FR (MISCELLANEOUS) ×1
SUCTION TUBE FRAZIER 10FR DISP (MISCELLANEOUS) ×1 IMPLANT
SUT ETHIBOND 5 LR DA (SUTURE) ×6 IMPLANT
SUT ETHILON 2 0 FS 18 (SUTURE) IMPLANT
SUT ETHILON 3 0 PS 1 (SUTURE) IMPLANT
SUT FIBERWIRE #2 38 T-5 BLUE (SUTURE)
SUT MNCRL AB 3-0 PS2 27 (SUTURE) ×1 IMPLANT
SUT VIC AB 0 CT1 27 (SUTURE)
SUT VIC AB 0 CT1 27XBRD ANBCTR (SUTURE) IMPLANT
SUT VIC AB 2-0 CT1 27 (SUTURE)
SUT VIC AB 2-0 CT1 TAPERPNT 27 (SUTURE) IMPLANT
SUTURE FIBERWR #2 38 T-5 BLUE (SUTURE) IMPLANT
SYR CONTROL 10ML LL (SYRINGE) IMPLANT
TOWEL GREEN STERILE (TOWEL DISPOSABLE) ×4 IMPLANT
TOWEL GREEN STERILE FF (TOWEL DISPOSABLE) ×2 IMPLANT
TUBE CONNECTING 12X1/4 (SUCTIONS) ×2 IMPLANT
UNDERPAD 30X30 (UNDERPADS AND DIAPERS) ×2 IMPLANT
WATER STERILE IRR 1000ML POUR (IV SOLUTION) ×2 IMPLANT

## 2019-10-25 NOTE — Anesthesia Postprocedure Evaluation (Signed)
Anesthesia Post Note  Patient: SHALAINA GUARDIOLA  Procedure(s) Performed: OPEN REDUCTION INTERNAL (ORIF) FIXATION LEFT PATELLA (Left Knee)     Patient location during evaluation: PACU Anesthesia Type: Regional and Spinal Level of consciousness: oriented and awake and alert Pain management: pain level controlled Vital Signs Assessment: post-procedure vital signs reviewed and stable Respiratory status: spontaneous breathing, respiratory function stable and patient connected to nasal cannula oxygen Cardiovascular status: blood pressure returned to baseline and stable Postop Assessment: no headache, no backache and no apparent nausea or vomiting Anesthetic complications: no    Last Vitals:  Vitals:   10/25/19 1831 10/25/19 1847  BP: 117/75 107/78  Pulse: 64 66  Resp: 17 (!) 21  Temp:    SpO2: 98% 95%    Last Pain:  Vitals:   10/25/19 1900  TempSrc:   PainSc: [place]

## 2019-10-25 NOTE — Anesthesia Procedure Notes (Signed)
Procedure Name: MAC Date/Time: 10/25/2019 2:25 PM Performed by: Griffin Dakin, CRNA Pre-anesthesia Checklist: Patient identified, Emergency Drugs available, Suction available, Patient being monitored and Timeout performed Patient Re-evaluated:Patient Re-evaluated prior to induction Oxygen Delivery Method: Simple face mask Induction Type: IV induction Dental Injury: Teeth and Oropharynx as per pre-operative assessment

## 2019-10-25 NOTE — Progress Notes (Signed)
Orthopedic Tech Progress Note Patient Details:  [name] Long February 28, 1941 [id number] Called [employer] for brace. Patient ID: [name] Long, female   DOB: Nov 20, 1940, 78 y.o.   MRN: [id number]   Braulio Bosch 10/25/2019, 3:25 PM

## 2019-10-25 NOTE — H&P (Signed)
ORTHOPAEDIC H and P  REQUESTING PHYSICIAN: Nicholes Stairs, MD  PCP:  Sinda Du, MD  Chief Complaint: Left patella fracture  HPI: [name] Long is a 78 y.o. female who complains of left patella fracture with loss of extensor mechanism.  She is here today for open reduction internal fixation.  No new complaints at this time.  Previously discussed the recommendation in the office as well as risk, benefits, and convalescence.  Past Medical History:  Diagnosis Date  . Anemia   . Arthritis   . Biliary acute pancreatitis 2006   s/p ERCP with stent, sphincterotomy, stone extraction, subsequent stent removal  several weeks later  . Hypertension   . Kidney anomaly, congenital    solitary left kidney  . Myocardial infarction ([place])   . PE (pulmonary embolism)   . Peripheral vascular disease ([place])    blood clots in legs  . Schizophrenia ([place])    pt denies   Past Surgical History:  Procedure Laterality Date  . ABDOMINAL HYSTERECTOMY    . CHOLECYSTECTOMY    . COLONOSCOPY    . UMBILICAL HERNIA REPAIR     Social History   Socioeconomic History  . Marital status: Single    Spouse name: Not on file  . Number of children: Not on file  . Years of education: Not on file  . Highest education level: Not on file  Occupational History  . Not on file  Social Needs  . Financial resource strain: Not on file  . Food insecurity    Worry: Not on file    Inability: Not on file  . Transportation needs    Medical: Not on file    Non-medical: Not on file  Tobacco Use  . Smoking status: Never Smoker  . Smokeless tobacco: Never Used  Substance and Sexual Activity  . Alcohol use: Not Currently    Comment: heavy drinker in the past  . Drug use: No  . Sexual activity: Not on file  Lifestyle  . Physical activity    Days per week: Not on file    Minutes per session: Not on file  . Stress: Not on file  Relationships  . Social Herbalist on phone: Not on file     Gets together: Not on file    Attends religious service: Not on file    Active member of club or organization: Not on file    Attends meetings of clubs or organizations: Not on file    Relationship status: Not on file  Other Topics Concern  . Not on file  Social History Narrative  . Not on file   Family History  Problem Relation Age of Onset  . Colon cancer Other        Mother, Brother, unsure age   No Known Allergies Prior to Admission medications   Medication Sig Start Date End Date Taking? Authorizing Provider  metoprolol tartrate (LOPRESSOR) 25 MG tablet Take 12.5 mg by mouth 2 (two) times daily.    Yes [provider]  traMADol (ULTRAM) 50 MG tablet Take 1 tablet (50 mg total) by mouth every 6 (six) hours as needed. Patient taking differently: Take 50 mg by mouth every 6 (six) hours as needed for moderate pain.  10/13/19  Yes Daleen Bo, MD  XARELTO 20 MG TABS tablet Take 20 mg by mouth daily. 10/10/19   [provider]   No results found.  Positive ROS: All other systems have been reviewed  and were otherwise negative with the exception of those mentioned in the HPI and as above.  Physical Exam: General: Alert, no acute distress Cardiovascular: No pedal edema Respiratory: No cyanosis, no use of accessory musculature GI: No organomegaly, abdomen is soft and non-tender Skin: No lesions in the area of chief complaint Neurologic: Sensation intact distally Psychiatric: Patient is competent for consent with normal mood and affect Lymphatic: No axillary or cervical lymphadenopathy  MUSCULOSKELETAL:  Left lower extremity is neurovascular intact.  Assessment: 1.  Closed left transverse patella fracture  Plan: -Plan for open reduction internal fixation of the left patella today to restore extensor mechanism.  We again reviewed the risk, benefits, and indications of this procedure at length.  All questions were solicited and answered to her  satisfaction.  -We will plan for admission postoperatively for in-house physical therapy to ensure safety at discharge.  - The risks, benefits, and alternatives were discussed with the patient. There are risks associated with the surgery including, but not limited to, problems with anesthesia (death), infection, fracture of bones, loosening or failure of implants, malunion, nonunion, hematoma (blood accumulation) which may require surgical drainage, blood clots, pulmonary embolism, nerve injury (foot drop), and blood vessel injury. The patient understands these risks and elects to proceed.     Yolonda Kida, MD Cell [phone]    10/25/2019 12:29 PM

## 2019-10-25 NOTE — Brief Op Note (Signed)
10/25/2019  3:16 PM  PATIENT:  [name] Long  78 y.o. female  PRE-OPERATIVE DIAGNOSIS:  Left patella fracture  POST-OPERATIVE DIAGNOSIS:  Left patella fracture  PROCEDURE:  Procedure(s) with comments: OPEN REDUCTION INTERNAL (ORIF) FIXATION LEFT PATELLA (Left) - 184mins  SURGEON:  Surgeon(s) and Role:    * Nicholes Stairs, MD - Primary   ASSISTANTS: Laure Kidney, RNFA April Green, RNFA   ANESTHESIA:   regional and spinal  EBL:  10 cc  BLOOD ADMINISTERED:none  DRAINS: none   LOCAL MEDICATIONS USED:  NONE  SPECIMEN:  No Specimen  DISPOSITION OF SPECIMEN:  N/A  COUNTS:  YES  TOURNIQUET:  * Missing tourniquet times found for documented tourniquets in log: [id number] *  DICTATION: .Note written in EPIC  PLAN OF CARE: Admit for overnight observation  PATIENT DISPOSITION:  PACU - hemodynamically stable.   Delay start of Pharmacological VTE agent (>24hrs) due to surgical blood loss or risk of bleeding: not applicable

## 2019-10-25 NOTE — Transfer of Care (Signed)
Immediate Anesthesia Transfer of Care Note  Patient: [name] Long  Procedure(s) Performed: OPEN REDUCTION INTERNAL (ORIF) FIXATION LEFT PATELLA (Left Knee)  Patient Location: PACU  Anesthesia Type:MAC  Level of Consciousness: drowsy  Airway & Oxygen Therapy: Patient connected to face mask oxygen  Post-op Assessment: Report given to RN and Post -op Vital signs reviewed and stable  Post vital signs: Reviewed and stable  Last Vitals:  Vitals Value Taken Time  BP 110/62 10/25/19 1532  Temp    Pulse 56 10/25/19 1538  Resp 14 10/25/19 1538  SpO2 100 % 10/25/19 1538  Vitals shown include unvalidated device data.  Last Pain:  Vitals:   10/25/19 1227  TempSrc:   PainSc: 10-Worst pain ever      Patients Stated Pain Goal: 3 (75/91/63 8466)  Complications: No apparent anesthesia complications

## 2019-10-25 NOTE — Anesthesia Procedure Notes (Signed)
Spinal  Patient location during procedure: OR Start time: 10/25/2019 2:15 PM End time: 10/25/2019 2:20 PM Staffing Anesthesiologist: Pervis Hocking, DO Performed: anesthesiologist  Preanesthetic Checklist Completed: patient identified, surgical consent, pre-op evaluation, timeout performed, IV checked, risks and benefits discussed and monitors and equipment checked Spinal Block Patient position: sitting Prep: site prepped and draped and DuraPrep Patient monitoring: cardiac monitor, continuous pulse ox and blood pressure Approach: midline Location: L3-4 Injection technique: single-shot Needle Needle type: Pencan  Needle gauge: 24 G Needle length: 9 cm Assessment Sensory level: T6 Additional Notes Functioning IV was confirmed and monitors were applied. Sterile prep and drape, including hand hygiene and sterile gloves were used. The patient was positioned and the spine was prepped. The skin was anesthetized with lidocaine.  Free flow of clear CSF was obtained prior to injecting local anesthetic into the CSF.  The spinal needle aspirated freely following injection.  The needle was carefully withdrawn.  The patient tolerated the procedure well.

## 2019-10-25 NOTE — Op Note (Addendum)
10/25/2019  3:20 PM  PATIENT:  [name] Long    PRE-OPERATIVE DIAGNOSIS:  Left patella fracture  POST-OPERATIVE DIAGNOSIS:  Same  PROCEDURE:  OPEN REDUCTION INTERNAL (ORIF) FIXATION LEFT PATELLA  SURGEON:  Nicholes Stairs, MD  ASSISTANT: Laure Kidney, RNFA April Green, RNFA  ANESTHESIA:   Spinal with Adductor canal  PREOPERATIVE INDICATIONS:  [name] Long is a  78 y.o. female with a diagnosis of Left patella fracture who elected for surgical management in order to restore the function of the extensor mechanism.    The risks benefits and alternatives were discussed with the patient preoperatively including but not limited to the risks of infection, bleeding, nerve injury, cardiopulmonary complications, the need for revision surgery, hardware prominence, hardware failure, the need for hardware removal, nonunion, malunion, posttraumatic arthritis, stiffness, loss of strength and function, among others, and the patient was willing to proceed.  OPERATIVE IMPLANTS:  Arthrex 4.0 mm cannulated screws x 2  Arthrex Fibertape x 1 for cerclage suture through cannulated screws  OPERATIVE FINDINGS: Displaced, transverse patella fracture, with intact retinaculum.  OPERATIVE PROCEDURE: The patient was brought to the operating room and placed in the supine position. Spinal anesthesia was administered. IV antibiotics were given. The lower extremity was prepped and draped in usual sterile fashion. The leg was elevated and exsanguinated and the tourniquet was inflated. Time out was performed.   Anterior incision was made over the patella and the fracture fragments identified and cleaned of hematoma. The retinaculum was in fact, NOT torn on either side.  I reduced the fracture anatomically and held provisionally with a clamp and placed 2 guidewires for the cannulated screws.  The lengths were measured, after being confirmed on C-arm, and then I placed the screws, taking care to make sure that  there were threads only on the proximal segment, providing compression at the fracture site, and the tips were not prominent proximally.  C-arm used to confirm reduction and position of the screws, and once I was satisfied with this I then used a Keith needle through the screws bringing a total of 1 #Fiberwire in a figure-of-eight type fashion. This provided excellent secondary fixation. I left the screw lengths slightly short of the far cortex in order to minimize the risk for rupture of the FiberTape over the tip of the screws.  The wounds were irrigated copiously, followed by Vicryl closure for the subcutaneous tissue with 3-0 subcuticular monocryl for skin and Steri-Strips and sterile gauze for the skin. The wounds were also injected. A knee immobilizer was applied. The patient was awakened and returned to the PACU in stable and satisfactory condition. There were no complications.    Disposition: [name] will be 50% weightbearing on the left leg only with it in full extension.  Otherwise no range of motion at this time.  She will maintain her Bledsoe brace.  We will admit her overnight for observation and inpatient physical therapy to ensure safe disposition home.  Given history of thromboembolic events we will return her back to her 20 mg Xarelto postoperatively.  She will follow-up with me in the office in 2 weeks for wound check.

## 2019-10-25 NOTE — Anesthesia Preprocedure Evaluation (Signed)
Anesthesia Evaluation  Patient identified by MRN, date of birth, ID band Patient awake    Reviewed: Allergy & Precautions, NPO status , Patient's Chart, lab work & pertinent test results  Airway Mallampati: I  TM Distance: >3 FB Neck ROM: Full    Dental  (+) Partial Lower, Partial Upper, Poor Dentition, Dental Advisory Given   Pulmonary asthma ,    Pulmonary exam normal        Cardiovascular hypertension, Pt. on medications and Pt. on home beta blockers + Past MI and + Peripheral Vascular Disease  Normal cardiovascular exam  xarelto- last dose last month   Neuro/Psych PSYCHIATRIC DISORDERS Schizophrenia negative neurological ROS     GI/Hepatic negative GI ROS, Neg liver ROS,   Endo/Other    Renal/GU Renal disease     Musculoskeletal  (+) Arthritis , Osteoarthritis,  L leg patellar fx   Abdominal Normal abdominal exam  (+)   Peds  Hematology   Anesthesia Other Findings   Reproductive/Obstetrics                             Anesthesia Physical Anesthesia Plan  ASA: III  Anesthesia Plan: MAC, Regional and Spinal   Post-op Pain Management:  Regional for Post-op pain   Induction: Intravenous  PONV Risk Score and Plan: 2 and Propofol infusion, TIVA and Treatment may vary due to age or medical condition  Airway Management Planned: Natural Airway and Simple Face Mask  Additional Equipment: None  Intra-op Plan:   Post-operative Plan:   Informed Consent: I have reviewed the patients History and Physical, chart, labs and discussed the procedure including the risks, benefits and alternatives for the proposed anesthesia with the patient or authorized representative who has indicated his/her understanding and acceptance.       Plan Discussed with: CRNA  Anesthesia Plan Comments:         Anesthesia Quick Evaluation

## 2019-10-25 NOTE — Anesthesia Procedure Notes (Signed)
Anesthesia Regional Block: Adductor canal block   Pre-Anesthetic Checklist: ,, timeout performed, Correct Patient, Correct Site, Correct Laterality, Correct Procedure, Correct Position, site marked, Risks and benefits discussed,  Surgical consent,  Pre-op evaluation,  At surgeon's request and post-op pain management  Laterality: Left  Prep: Maximum Sterile Barrier Precautions used, chloraprep       Needles:  Injection technique: Single-shot  Needle Type: Echogenic Stimulator Needle     Needle Length: 9cm  Needle Gauge: 22     Additional Needles:   Procedures:,,,, ultrasound used (permanent image in chart),,,,  Narrative:  Start time: 10/25/2019 2:00 PM End time: 10/25/2019 2:05 PM Injection made incrementally with aspirations every 5 mL.  Performed by: Personally  Anesthesiologist: Pervis Hocking, DO  Additional Notes: Monitors applied. No increased pain on injection. No increased resistance to injection. Injection made in 5cc increments. Good needle visualization. Patient tolerated procedure well.

## 2019-10-25 NOTE — Discharge Instructions (Signed)
Orthopedic discharge instructions:  -Maintain postoperative bandages for 3 days.  You may remove the bandages at that time and begin showering.  Please do not submerge underwater.  -You are okay to put 50% weight on the left leg while wearing your brace when it is locked in full extension.  Otherwise do not begin bending the knee until instructed to do so by your therapist.  He may remove the brace for showering and getting dressed.  Otherwise it should be worn at all times.  -Given your history of venous thromboembolus, will have you resume the 20 mg Xarelto once per day following discharge from the hospital.  -For mild to moderate pain use Tylenol and/or ibuprofen around-the-clock.  For breakthrough pain use Norco as needed.  -Apply ice to the left knee for 30 minutes at a time throughout the day as able.  -Return to see Dr. Stann Mainland in 2 weeks for routine wound check.

## 2019-10-26 ENCOUNTER — Other Ambulatory Visit: Payer: Self-pay

## 2019-10-26 ENCOUNTER — Encounter (HOSPITAL_COMMUNITY): Payer: Self-pay | Admitting: Orthopedic Surgery

## 2019-10-26 MED ORDER — ADULT MULTIVITAMIN W/MINERALS CH
1.0000 | ORAL_TABLET | Freq: Every day | ORAL | Status: DC
  Administered 2019-10-26 – 2019-10-27 (×2): 1 via ORAL
  Filled 2019-10-26 (×2): qty 1

## 2019-10-26 NOTE — Evaluation (Signed)
Occupational Therapy Evaluation Patient Details Name: [name] Long MRN: [id number] DOB: 08-22-1941 Today's Date: 10/26/2019    History of Present Illness [name] Long is a 78 y.o. female who complains of left patella fracture with loss of extensor mechanism. Now s/p ORIF, 50%PWB, bledsoe brace   Clinical Impression   Pt is typically independent. She has primarily stayed in her bed or transferred to [facility] since her fall. Pt presents with generalized weakness and impaired standing balance. Educated in compensatory techniques for LB ADL and use of AE, how to transport items safely with RW and use of 3 in 1 over toilet to avoid having to empty it. Instructed in use of gait belt for helping L LE in and out of bed.    Follow Up Recommendations  Home health OT    Equipment Recommendations  None recommended by OT    Recommendations for Other Services       Precautions / Restrictions Precautions Precautions: Fall Required Braces or Orthoses: Knee Immobilizer - Left Knee Immobilizer - Left: On at all times Restrictions Weight Bearing Restrictions: Yes LLE Weight Bearing: Partial weight bearing LLE Partial Weight Bearing Percentage or Pounds: 50      Mobility Bed Mobility               General bed mobility comments: pt received in chair  Transfers Overall transfer level: Needs assistance Equipment used: Rolling walker (2 wheeled) Transfers: Sit to/from [facility] Sit to Stand: Min guard Stand pivot transfers: Min assist       General transfer comment: Cues for hand placement, safety, and for LLE placement during sit<>stand transitions; steadying assist during transfer    Balance                                           ADL either performed or assessed with clinical judgement   ADL Overall ADL's : Needs assistance/impaired Eating/Feeding: Independent;Sitting   Grooming: Wash/dry hands;Wash/dry face;Sitting;Set up   Upper Body  Bathing: Set up;Sitting   Lower Body Bathing: Moderate assistance;Sitting/lateral leans   Upper Body Dressing : Set up;Sitting   Lower Body Dressing: Moderate assistance;Sitting/lateral leans   Toilet Transfer: Minimal assistance;RW;Regular Toilet   Toileting- Clothing Manipulation and Hygiene: Set up;Sitting/lateral lean         General ADL Comments: Educated in use of AE for LB ADL and compensatory techniques. Recommended walker bag or basket to safely carry items.     Vision Patient Visual Report: No change from baseline       Perception     Praxis      Pertinent Vitals/Pain Faces Pain Scale: Hurts a little bit Pain Location: L knee Pain Descriptors / Indicators: Sore Pain Intervention(s): Monitored during session;Repositioned     Hand Dominance Right   Extremity/Trunk Assessment Upper Extremity Assessment Upper Extremity Assessment: Overall WFL for tasks assessed   Lower Extremity Assessment Lower Extremity Assessment: Defer to PT evaluation       Communication Communication Communication: No difficulties   Cognition Arousal/Alertness: Awake/alert Behavior During Therapy: WFL for tasks assessed/performed Overall Cognitive Status: Within Functional Limits for tasks assessed                                     General Comments       Exercises  Shoulder Instructions      Home Living Family/patient expects to be discharged to:: Private residence Living Arrangements: Alone Available Help at Discharge: Family;Friend(s);Available PRN/intermittently;Neighbor Type of Home: Apartment Home Access: Stairs to enter [employer] of Steps: 7 Entrance Stairs-Rails: Right;Left Home Layout: One level     Bathroom Shower/Tub: Chief Strategy Officer: Standard     Home Equipment: Walker - standard;Bedside commode   Additional Comments: She has been managing at home for a bit of time between time of injury and surgery;  It sounds like she calls on neighbors for prn assist (one example, emptying BSC, among other things, though she didn't get specific re: ADLs, esp dressing); she is a client of [employer]      Prior Functioning/Environment Level of Independence: Needs assistance  Gait / Transfers Assistance Needed: has been managing with standard walker, transferring to [facility] ADL's / Homemaking Assistance Needed: has primarily been sponge bathing and assisted for LB bathing and dressing            OT Problem List: Impaired balance (sitting and/or standing);Decreased knowledge of use of DME or AE      OT Treatment/Interventions: Self-care/ADL training;DME and/or AE instruction;Therapeutic activities;Patient/family education;Balance training    OT Goals(Current goals can be found in the care plan section) Acute Rehab OT Goals Patient Stated Goal: wants to be able to manage at home better OT Goal Formulation: With patient Time For Goal Achievement: 11/09/19 Potential to Achieve Goals: Good ADL Goals Pt Will Perform Grooming: with modified independence;standing Pt Will Perform Lower Body Bathing: with modified independence;with adaptive equipment;sit to/from stand Pt Will Perform Lower Body Dressing: with modified independence;with adaptive equipment;sit to/from stand Pt Will Transfer to Toilet: with modified independence;ambulating;bedside commode(over toilet) Pt Will Perform Toileting - Clothing Manipulation and hygiene: with modified independence;sit to/from stand Additional ADL Goal #1: Pt will adhere to PWB on L LE modified independently.  OT Frequency: Min 2X/week   Barriers to D/C:            Co-evaluation              AM-PAC OT "6 Clicks" Daily Activity     Outcome Measure Help from another person eating meals?: None Help from another person taking care of personal grooming?: A Little Help from another person toileting, which includes using toliet, bedpan, or urinal?: A  Little Help from another person bathing (including washing, rinsing, drying)?: A Lot Help from another person to put on and taking off regular upper body clothing?: None Help from another person to put on and taking off regular lower body clothing?: A Lot 6 Click Score: 18   End of Session Equipment Utilized During Treatment: Gait belt;Rolling walker  Activity Tolerance: Patient tolerated treatment well Patient left: in chair;with call bell/phone within reach;with chair alarm set  OT Visit Diagnosis: Unsteadiness on feet (R26.81);Other abnormalities of gait and mobility (R26.89);Pain                Time: 9562-1308 OT Time Calculation (min): 36 min Charges:  OT General Charges $OT Visit: 1 Visit OT Evaluation $OT Eval Moderate Complexity: 1 Mod OT Treatments $Self Care/Home Management : 8-22 mins  Martie Round, OTR/L Acute Rehabilitation Services Pager: [phone] Office: [phone]  Evern Bio 10/26/2019, 4:56 PM

## 2019-10-26 NOTE — Progress Notes (Signed)
   Subjective:  Patient reports pain as mild.  Did well with therapy today.  Some issues with pain.  Not ready to go home just yet as there are some issues with home health PT  Objective:   VITALS:   Vitals:   10/26/19 0519 10/26/19 0740 10/26/19 1309 10/26/19 2015  BP: 113/76 (!) 107/58 121/66 112/69  Pulse: 64 (!) 57 64 (!) 50  Resp: 14 14 14 20   Temp: 98.1 F (36.7 C) (!) 97.4 F (36.3 C) 97.9 F (36.6 C) 98.3 F (36.8 C)  TempSrc: Oral Oral Oral Oral  SpO2: 100% 100% 100% 98%  Weight:      Height:        Neurologically intact Bandages c/d/i  Lab Results  Component Value Date   WBC 4.9 10/25/2019   HGB 13.2 10/25/2019   HCT 41.2 10/25/2019   MCV 95.8 10/25/2019   PLT 184 10/25/2019   BMET    Component Value Date/Time   NA 137 10/25/2019 1151   K 3.6 10/25/2019 1151   CL 102 10/25/2019 1151   CO2 23 10/25/2019 1151   GLUCOSE 114 (H) 10/25/2019 1151   BUN 14 10/25/2019 1151   CREATININE 0.84 10/25/2019 1151   CALCIUM 8.9 10/25/2019 1151   GFRNONAA >60 10/25/2019 1151   GFRAA >60 10/25/2019 1151     Assessment/Plan: 1 Day Post-Op   Active Problems:   Displaced transverse fracture of left patella, initial encounter for closed fracture   Left patella fracture   Up with therapy 50% WB to left leg Plan for dc home tomorrow. Pain control SCDs and Lovenox for DVT ppx   Nicholes Stairs 10/26/2019, 9:59 PM   Geralynn Rile, MD [phone]

## 2019-10-26 NOTE — Progress Notes (Signed)
Initial Nutrition Assessment  RD working remotely.  DOCUMENTATION CODES:   Not applicable  INTERVENTION:   -MVI with minerals daily -Continue with regular diet -Encourage adequate PO intake  NUTRITION DIAGNOSIS:   Increased nutrient needs related to post-op healing as evidenced by estimated needs.  GOAL:   Patient will meet greater than or equal to 90% of their needs  MONITOR:   PO intake, Supplement acceptance, Labs, Weight trends, Skin, I & O's  REASON FOR ASSESSMENT:   Malnutrition Screening Tool    ASSESSMENT:   [name] Long is a 77 y.o. female who complains of left patella fracture with loss of extensor mechanism.  She is here today for open reduction internal fixation.  No new complaints at this time.  Previously discussed the recommendation in the office as well as risk, benefits, and convalescence.  Pt admitted with lt patella fracture.   12/8- s/p PROCEDURE:  OPEN REDUCTION INTERNAL (ORIF) FIXATION LEFT PATELLA  Reviewed I/O's: +1.3 L x 24 hours   Attempted to speak with pt via phone, however, no answer.   Reviewed meal completion records, pt consumed 100% of breakfast.   Reviewed wt hx. Wt has been stable over the past 2 weeks, however, pt with distant history of weight loss.   Therapies recommending home with home health services.   Labs reviewed.   Diet Order:   Diet Order            Diet regular Room service appropriate? Yes; Fluid consistency: Thin  Diet effective now              EDUCATION NEEDS:   No education needs have been identified at this time  Skin:  Skin Assessment: Skin Integrity Issues: Skin Integrity Issues:: Incisions Incisions: closed lt knee  Last BM:  10/23/19  Height:   Ht Readings from Last 1 Encounters:  10/25/19 5\' 2"  (1.575 m)    Weight:   Wt Readings from Last 1 Encounters:  10/25/19 49.9 kg    Ideal Body Weight:  50 kg  BMI:  Body mass index is 20.12 kg/m.  Estimated Nutritional Needs:    Kcal:  1250-1450  Protein:  60-75 grams  Fluid:  > 1.2 L    Mccall Lomax A. Jimmye Norman, RD, LDN, [place] Registered Dietitian II Certified Diabetes Care and Education Specialist Pager: [phone] After hours Pager: [phone]

## 2019-10-26 NOTE — Progress Notes (Signed)
Report given by CJ. Pt stable. Pt pain managed. Pt states she would like a consult with a Education officer, museum d/t her inability to pay for daily needs such as medication. Will pass on in hand-off report to day shift. Will continue to monitor.

## 2019-10-26 NOTE — Evaluation (Signed)
Physical Therapy Evaluation Patient Details Name: [name] Long MRN: [id number] DOB: [birth year] Today's Date: 10/26/2019   History of Present Illness  [name] Long is a 78 y.o. female who complains of left patella fracture with loss of extensor mechanism. Now s/p ORIF, 50%PWB, bledsoe brace;  has a past medical history of Anemia, Arthritis, Biliary acute pancreatitis (2006), Hypertension, Kidney anomaly, congenital, Myocardial infarction [facility]), PE (pulmonary embolism), Peripheral vascular disease ([place]), and Schizophrenia ([place]).   Clinical Impression  Patient is s/p above surgery resulting in functional limitations due to the deficits listed below (see PT Problem List). Independent prior to injury, needing assist (in particular with ADLs) and using standard walker leading up to surgery; Presents with decr functional mobility, requirement to maintain PWB on LLE; She expresses concern over recent difficulty managing at home;  Patient will benefit from skilled PT to increase their independence and safety with mobility to allow discharge to the venue listed below.       Follow Up Recommendations Home health PT;Other (comment)(Strongly rec HHAide; worth considering HHOT(will defer to OT)    Equipment Recommendations  Rolling walker with 5" wheels;Other (comment)(Youth-sized RW at tallest level)    Recommendations for Other Services OT consult(ordered per protocol)     Precautions / Restrictions Precautions Precautions: Fall Restrictions Weight Bearing Restrictions: Yes LLE Weight Bearing: Partial weight bearing LLE Partial Weight Bearing Percentage or Pounds: 50 Other Position/Activity Restrictions: Knee extension at all times      Mobility  Bed Mobility Overal bed mobility: Needs Assistance Bed Mobility: Supine to Sit     Supine to sit: Min guard;Min assist     General bed mobility comments: Slightest bit of min assist to initiate moving LLE to the EOB; otherwise minguard assist  for safety, and cues for techqniue; used bed rails  Transfers Overall transfer level: Needs assistance Equipment used: Rolling walker (2 wheeled) Transfers: Sit to/from Stand Sit to Stand: Min guard         General transfer comment: Cues for hand placement, safety, and for LLE placement during sit<>stand transitions; no physical assist needed  Ambulation/Gait Ambulation/Gait assistance: Min assist;Min guard Gait Distance (Feet): 40 Feet(x2) Assistive device: Rolling walker (2 wheeled) Gait Pattern/deviations: Step-to pattern     General Gait Details: initial demonstration of technique to maintain PWB LLE during amb; minguard for safety, and occasional cues to push down into RW to [employer] LLE for PEB  Stairs Stairs: Yes Stairs assistance: Min guard Stair Management: One rail Left;Step to pattern;Sideways Number of Stairs: 3 General stair comments: Demo for technique and PWB; Overall she performed well with cues to lean heavily on rail to maintain PWB  Wheelchair Mobility    Modified Rankin (Stroke Patients Only)       Balance Overall balance assessment: Mild deficits observed, not formally tested(Strongly rec RW)                                           Pertinent Vitals/Pain Pain Assessment: Faces Faces Pain Scale: Hurts a little bit Pain Location: L knee Pain Descriptors / Indicators: Sore Pain Intervention(s): Monitored during session    Home Living Family/patient expects to be discharged to:: Private residence Living Arrangements: Alone Available Help at Discharge: Family;Friend(s);Available PRN/intermittently Type of Home: Apartment Home Access: Stairs to enter Entrance Stairs-Rails: Right;Left(slightly wide apart) Entrance Stairs-Number of Steps: 7 Home Layout: One level Home Equipment: Environmental consultant -  standard;Bedside commode Additional Comments: She has been managing at home for a bit of time between time of injury and surgery; It sounds  like she calls on neighbors for prn assist (one example, emptying BSC, among other things, though she didn't get specific re: ADLs, esp dressing); she is a client of [employer]    Prior Function Level of Independence: Needs assistance   Gait / Transfers Assistance Needed: has been managing with standard walker  ADL's / Homemaking Assistance Needed: been managign with BSC; neighbor empties for her        Hand Dominance        Extremity/Trunk Assessment   Upper Extremity Assessment Upper Extremity Assessment: Overall WFL for tasks assessed;Defer to OT evaluation    Lower Extremity Assessment Lower Extremity Assessment: LLE deficits/detail LLE Deficits / Details: knee immobilized in extension in Bledsoe brace; good fit       Communication   Communication: No difficulties;Other (comment)(though at times hard to understand)  Cognition Arousal/Alertness: Awake/alert Behavior During Therapy: WFL for tasks assessed/performed(somewhat impulsive) Overall Cognitive Status: Within Functional Limits for tasks assessed                                        General Comments      Exercises     Assessment/Plan    PT Assessment Patient needs continued PT services  PT Problem List Decreased strength;Decreased range of motion;Decreased activity tolerance;Decreased balance;Decreased mobility;Decreased coordination;Decreased knowledge of use of DME;Decreased safety awareness;Decreased knowledge of precautions;Pain       PT Treatment Interventions DME instruction;Gait training;Stair training;Functional mobility training;Therapeutic activities;Therapeutic exercise;Balance training;Cognitive remediation;Patient/family education    PT Goals (Current goals can be found in the Care Plan section)  Acute Rehab PT Goals Patient Stated Goal: wants to be able to manage at home better PT Goal Formulation: With patient Time For Goal Achievement: 11/09/19 Potential to Achieve  Goals: Good    Frequency Min 5X/week   Barriers to discharge Decreased caregiver support Lives alone; does not have 24 hour assist; Indicated she has had difficulty with ADLs managing at home prior to surgery    Co-evaluation               AM-PAC PT "6 Clicks" Mobility  Outcome Measure Help needed turning from your back to your side while in a flat bed without using bedrails?: A Little Help needed moving from lying on your back to sitting on the side of a flat bed without using bedrails?: A Little Help needed moving to and from a bed to a chair (including a wheelchair)?: A Little Help needed standing up from a chair using your arms (e.g., wheelchair or bedside chair)?: A Little Help needed to walk in hospital room?: A Little Help needed climbing 3-5 steps with a railing? : A Little 6 Click Score: 18    End of Session Equipment Utilized During Treatment: Gait belt Activity Tolerance: Patient tolerated treatment well Patient left: in chair;with call bell/phone within reach;with chair alarm set Nurse Communication: Mobility status PT Visit Diagnosis: Unsteadiness on feet (R26.81);Other abnormalities of gait and mobility (R26.89)    Time: [id number] PT Time Calculation (min) (ACUTE ONLY): 35 min   Charges:   PT Evaluation $PT Eval Low Complexity: 1 Low PT Treatments $Gait Training: 8-22 mins        Van ClinesHolly Lestine Rahe, PT  Acute Rehabilitation Services Pager [phone] Office [phone]  Levi Aland 10/26/2019, 10:20 AM

## 2019-10-26 NOTE — TOC Transition Note (Addendum)
Transition of Care [facility]) - CM/SW Discharge Note   Patient Details  Name: [name] Long MRN: [id number] Date of Birth: [birth year]  [facility] CM/SW Contact:  Midge Minium MSN, RN, NCM-BC, ACM-RN [phone] Phone Number: 10/26/2019, 3:47 PM   Clinical Narrative:    CM spoke to the patient to discuss the POC. The patient states living at home alone, with intermittent assistance with her ADLs being provided by her son and neighbors; Me[employer] on Wheels assists with nutritional meals. PCP/Demo verified. Patient is a 78 y.o. female who complains of left patella fracture with loss of extensor mechanism. Now s/p ORIF, 50%PWB, bledsoe brace. PT eval completed with HHPT/youth RW recommended. CM discussed PT recommendations and possible ST SNF d/t decreased caregiver support, with the patient declining and opting to transition home. CMS HH compare list and DME list provided to the patient in her room with no preference. Awaiting HH/DME orders with a request sent to Dr. Stann Mainland. Patient stated her son will be available to provide transportation home. Patient has [facility] Medicare and Medicaid with Rx $3-$4; patient declined having difficulty affording her Rx, just concerned that her son orders too much at one time.   Final next level of care: [place] Barriers to Discharge: No Barriers Identified   Patient Goals and CMS Choice Patient states their goals for this hospitalization and ongoing recovery are:: "to get home" CMS Medicare.gov Compare Post Acute Care list provided to:: Patient Choice offered to / list presented to : Patient   Discharge Plan and Services                DME Arranged: Walker rolling DME Agency: [employer] Date DME Agency Contacted: 10/26/19 Time DME Agency Contacted: [phone] Representative spoke with at DME Agency: [place] (liaison) [place] Arranged: PT, OT, Nurse's Aide          Social Determinants of Health ([place]) Interventions     Readmission  Risk Interventions No flowsheet data found.

## 2019-10-26 NOTE — Plan of Care (Signed)

## 2019-10-26 NOTE — Care Management Obs Status (Signed)
[place] NOTIFICATION   Patient Details  Name: [name] Long MRN: [id number] Date of Birth: [birth year]   Medicare Observation Status Notification Given:  Yes    Midge Minium MSN, RN, NCM-BC, ACM-RN [phone] 10/26/2019, 3:43 PM

## 2019-10-27 MED ORDER — ONDANSETRON 4 MG PO TBDP: 4.0000 mg | ORAL_TABLET | Freq: Three times a day (TID) | ORAL | 0 refills | Status: AC | PRN

## 2019-10-27 MED ORDER — HYDROCODONE-ACETAMINOPHEN 5-325 MG PO TABS: 1.0000 | ORAL_TABLET | ORAL | 0 refills | Status: AC | PRN

## 2019-10-27 NOTE — Plan of Care (Signed)

## 2019-10-27 NOTE — Progress Notes (Signed)
Physical Therapy Treatment Patient Details Name: [name] Long MRN: [id number] DOB: October 21, 1941 Today's Date: 10/27/2019    History of Present Illness [name] Long is a 78 y.o. female who complains of left patella fracture with loss of extensor mechanism. Now s/p ORIF, 50%PWB, bledsoe brace    PT Comments    Patient recently completed OT session and very willing to work with PT. Patient requires minguard with moderate cues with transfers and gait with RW for proper sequencing and to maintain PWB. She became fatigued after walking 60 ft and initiated conversation re: possibly going to SNF. Discussed at length, answering patient's questions. Ultimately she again refused SNF recommendation and wants to "try it at home." Therefore recommending maximum [facility] services for patient.     Follow Up Recommendations  Home health PT;SNF (rec SNF as pt lives alone with intermittent assist; pt refusing SNF and therefore recommend HHPT)     Equipment Recommendations  Rolling walker with 5" wheels;Other (comment)(Youth-sized RW at tallest level)    Recommendations for Other Services       Precautions / Restrictions Precautions Precautions: Fall Required Braces or Orthoses: Other Brace(Hinged knee brace) Knee Immobilizer - Left: On at all times Restrictions Weight Bearing Restrictions: Yes LLE Weight Bearing: Partial weight bearing LLE Partial Weight Bearing Percentage or Pounds: 50    Mobility  Bed Mobility Overal bed mobility: Modified Independent             General bed mobility comments: used gait belt to assist L LE OOB, increased time  Transfers Overall transfer level: Needs assistance Equipment used: Rolling walker (2 wheeled) Transfers: Sit to/from Stand Sit to Stand: Min guard         General transfer comment: Cues for hand placement, safety, and for LLE placement during sit<>stand transitions (pt "flopped" on first stand to sit with incr LLE pain; educated on proper  technique and within 5 minutes pt had forgotten and "flopped" again with reports of incr pain)  Ambulation/Gait Ambulation/Gait assistance: Min guard Gait Distance (Feet): 60 Feet Assistive device: Rolling walker (2 wheeled) Gait Pattern/deviations: Step-to pattern Gait velocity: deccr   General Gait Details: minguard for safety, and mod cues to push down into RW to [place] LLE for [facility]; cues for RW proximity   Stairs         General stair comments: Pt able to verbalize correct technique and reports no need to practice again; reports the technique used 12/09 is the way she's been doing it since injury at Thanksgiving   Wheelchair Mobility    Modified Rankin (Stroke Patients Only)       Balance Overall balance assessment: Mild deficits observed, not formally tested(Strongly rec RW)                                          Cognition Arousal/Alertness: Awake/alert Behavior During Therapy: WFL for tasks assessed/performed Overall Cognitive Status: Impaired/Different from baseline Area of Impairment: Memory                     Memory: Decreased short-term memory         General Comments: did not recall safe way for stand to sit to protect LLE despite educated <5 minutes prior      Exercises      General Comments General comments (skin integrity, edema, etc.): Lengthy discussion re: discharge plan as pt  began saying "this is a lot. I don't think I can do this at home." Discussed difficulty with her ADLs (see OT note) and answered pt's questions re: PT at SNF. Ultimately she again refused SNF recommendation.       Pertinent Vitals/Pain Pain Assessment: 0-10 Pain Score: 8  Pain Location: L knee Pain Descriptors / Indicators: Aching;Grimacing;Guarding Pain Intervention(s): Premedicated before session;Monitored during session;Limited activity within patient's tolerance;Repositioned;Other (comment)(educated on use of RW to limit WB)    Home  Living                      Prior Function            PT Goals (current goals can now be found in the care plan section) Acute Rehab PT Goals Patient Stated Goal: wants to be able to manage at home better Time For Goal Achievement: 11/09/19 Potential to Achieve Goals: Good Progress towards PT goals: Progressing toward goals    Frequency    Min 5X/week      PT Plan Discharge plan needs to be updated    Co-evaluation              AM-PAC PT "6 Clicks" Mobility   Outcome Measure  Help needed turning from your back to your side while in a flat bed without using bedrails?: A Little Help needed moving from lying on your back to sitting on the side of a flat bed without using bedrails?: A Little Help needed moving to and from a bed to a chair (including a wheelchair)?: A Little Help needed standing up from a chair using your arms (e.g., wheelchair or bedside chair)?: A Little Help needed to walk in hospital room?: A Little Help needed climbing 3-5 steps with a railing? : A Little 6 Click Score: 18    End of Session Equipment Utilized During Treatment: Gait belt Activity Tolerance: Patient tolerated treatment well Patient left: in chair;with call bell/phone within reach;with chair alarm set   PT Visit Diagnosis: Unsteadiness on feet (R26.81);Other abnormalities of gait and mobility (R26.89)     Time: 1040-1105 PT Time Calculation (min) (ACUTE ONLY): 25 min  Charges:  $Gait Training: 8-22 mins $Self Care/Home Management: 8-22                      Barry Brunner, PT Pager [phone]    Rexanne Mano 10/27/2019, 11:19 AM

## 2019-10-27 NOTE — Plan of Care (Signed)

## 2019-10-27 NOTE — Progress Notes (Signed)
Occupational Therapy Treatment Patient Details Name: [name] Long MRN: [id number] DOB: June 30, 1941 Today's Date: 10/27/2019    History of present illness [name] Long is a 78 y.o. female who complains of left patella fracture with loss of extensor mechanism. Now s/p ORIF, 50%PWB, bledsoe brace   OT comments  Pt performed toileting with BSC over toilet and min guard assist. Positioned on BSC at sink, completed ADL and educated in use of AE for LB ADL. Reinforced use of gait belt to help L LE OOB and need for some sort bag or apron tied to her walker for transporting items safely. Pt verbalizing understanding, but continue to recommend HHOT to reinforce and address IADL. Spoke to pt about the benefits of going to ST rehab, but pt refusing.  Follow Up Recommendations  Home health OT(pt is refusing SNF)    Equipment Recommendations  None recommended by OT    Recommendations for Other Services      Precautions / Restrictions Precautions Precautions: Fall Required Braces or Orthoses: Other Brace(Hinged knee brace) Knee Immobilizer - Left: On at all times Restrictions Weight Bearing Restrictions: Yes LLE Weight Bearing: Partial weight bearing LLE Partial Weight Bearing Percentage or Pounds: 50       Mobility Bed Mobility Overal bed mobility: Modified Independent             General bed mobility comments: used gait belt to assist L LE OOB, increased time  Transfers Overall transfer level: Needs assistance Equipment used: Rolling walker (2 wheeled) Transfers: Sit to/from Stand Sit to Stand: Min guard         General transfer comment: Cues for hand placement, safety, and for LLE placement during sit<>stand transitions; steadying assist during transfer    Balance                                           ADL either performed or assessed with clinical judgement   ADL Overall ADL's : Needs assistance/impaired     Grooming: Wash/dry hands;Wash/dry  face;Sitting;Set up   Upper Body Bathing: Minimal assistance;Sitting Upper Body Bathing Details (indicate cue type and reason): assisted with back Lower Body Bathing: Minimal assistance;Sitting/lateral leans;Sit to/from stand Lower Body Bathing Details (indicate cue type and reason): instructed in use of long handled bath sponge Upper Body Dressing : Set up;Sitting   Lower Body Dressing: Minimal assistance;Sit to/from stand Lower Body Dressing Details (indicate cue type and reason): educated in use of reacher and sock aide Toilet Transfer: Min guard;Ambulation;RW;BSC(over toilet) Toilet Transfer Details (indicate cue type and reason): educate pt in use of BSC over toilet Toileting- Clothing Manipulation and Hygiene: Min guard;Sit to/from stand       Functional mobility during ADLs: Health and safety inspector     Praxis      Cognition Arousal/Alertness: Awake/alert Behavior During Therapy: WFL for tasks assessed/performed Overall Cognitive Status: Impaired/Different from baseline Area of Impairment: Memory                     Memory: Decreased short-term memory                  Exercises     Shoulder Instructions       General Comments      Pertinent Vitals/ Pain  Pain Assessment: 0-10 Pain Score: 8  Pain Location: L knee Pain Descriptors / Indicators: Aching;Grimacing;Guarding Pain Intervention(s): Monitored during session;Repositioned;Patient requesting pain meds-RN notified;RN gave pain meds during session  Home Living                                          Prior Functioning/Environment              Frequency  Min 2X/week        Progress Toward Goals  OT Goals(current goals can now be found in the care plan section)  Progress towards OT goals: Progressing toward goals  Acute Rehab OT Goals Patient Stated Goal: wants to be able to manage at home better OT Goal Formulation:  With patient Time For Goal Achievement: 11/09/19 Potential to Achieve Goals: Good  Plan Discharge plan remains appropriate    Co-evaluation                 AM-PAC OT "6 Clicks" Daily Activity     Outcome Measure   Help from another person eating meals?: None Help from another person taking care of personal grooming?: A Little Help from another person toileting, which includes using toliet, bedpan, or urinal?: A Little Help from another person bathing (including washing, rinsing, drying)?: A Little Help from another person to put on and taking off regular upper body clothing?: None Help from another person to put on and taking off regular lower body clothing?: A Lot 6 Click Score: 19    End of Session Equipment Utilized During Treatment: Gait belt;Rolling walker;Other (comment)(hinged knee brace)  OT Visit Diagnosis: Unsteadiness on feet (R26.81);Other abnormalities of gait and mobility (R26.89);Pain Pain - Right/Left: Left Pain - part of body: Knee   Activity Tolerance Patient tolerated treatment well   Patient Left in chair;with call bell/phone within reach;with chair alarm set   Nurse Communication Mobility status;Patient requests pain meds        Time: 9937-1696 OT Time Calculation (min): 34 min  Charges: OT General Charges $OT Visit: 1 Visit OT Treatments $Self Care/Home Management : 23-37 mins  [name] Long, OTR/L Acute Rehabilitation Services Pager: [phone] Office: [phone]   [name] Long 10/27/2019, 10:43 AM

## 2019-10-27 NOTE — Progress Notes (Addendum)
Pt stable. Pain managed with one dose of tramadol. Pt sleeping well. Foley removed, purewick placed, pt voided 169mL @0400 . Will continue to monitor. Pt unable to read.

## 2019-10-27 NOTE — TOC Transition Note (Signed)
Transition of Care [facility]) - CM/SW Discharge Note   Patient Details  Name: [name] Long MRN: [id number] Date of Birth: 1941/06/06  Transition of [facility]) CM/SW Contact:  Midge Minium MSN, RN, NCM-BC, ACM-RN [phone] Phone Number: 10/27/2019, 2:43 PM   Clinical Narrative:    [place] arranged with [facility] for HHPT/OT/aide; AVS Updated. No further needs from CM.    Final next level of care: [place] Barriers to Discharge: No Barriers Identified   Patient Goals and CMS Choice Patient states their goals for this hospitalization and ongoing recovery are:: "to get home" CMS Medicare.gov Compare Post Acute Care list provided to:: Patient Choice offered to / list presented to : Patient   Discharge Plan and Services                DME Arranged: Walker rolling DME Agency: [employer] Date DME Agency Contacted: 10/26/19 Time DME Agency Contacted: [phone] Representative spoke with at DME Agency: Bertrum Sol (liaison) [place] Arranged: PT, OT, Nurse's Aide [place] Agency: [facility] [employer] [facility] [employer] Date [place]: 10/27/19 Time Welcome: [place] Representative spoke with at [place]: [place] (liaison)  Social Determinants of Health ([place]) Interventions     Readmission Risk Interventions No flowsheet data found.

## 2019-10-27 NOTE — Progress Notes (Signed)
Provided discharge education instructions, all questions and concerns addressed, DME delivered to room, Pt not in distress, discharged home with belongings accompanied by son, [name] Long.

## 2019-10-27 NOTE — Progress Notes (Signed)
   Subjective:  Patient reports pain as mild.  Feels ready to go home today.  Objective:   VITALS:   Vitals:   10/26/19 2015 10/27/19 0400 10/27/19 0735 10/27/19 0911  BP: 112/69 117/63 131/82   Pulse: (!) 50 62 61 63  Resp: 20 20 16    Temp: 98.3 F (36.8 C) 98.3 F (36.8 C) 98.3 F (36.8 C)   TempSrc: Oral Oral Oral   SpO2: 98% 95% 100%   Weight:      Height:        Neurologically intact Bandages c/d/i Distally motor intact  Lab Results  Component Value Date   WBC 4.9 10/25/2019   HGB 13.2 10/25/2019   HCT 41.2 10/25/2019   MCV 95.8 10/25/2019   PLT 184 10/25/2019   BMET    Component Value Date/Time   NA 137 10/25/2019 1151   K 3.6 10/25/2019 1151   CL 102 10/25/2019 1151   CO2 23 10/25/2019 1151   GLUCOSE 114 (H) 10/25/2019 1151   BUN 14 10/25/2019 1151   CREATININE 0.84 10/25/2019 1151   CALCIUM 8.9 10/25/2019 1151   GFRNONAA >60 10/25/2019 1151   GFRAA >60 10/25/2019 1151     Assessment/Plan: 2 Days Post-Op   Active Problems:   Displaced transverse fracture of left patella, initial encounter for closed fracture   Left patella fracture   Up with therapy 50% WB to left leg Plan for dc home today once HHPT organized Pain control SCDs and Lovenox for DVT ppx   Nicholes Stairs 10/27/2019, 10:22 AM   Geralynn Rile, MD [phone]

## 2019-10-28 NOTE — Discharge Summary (Signed)
Patient ID: [name] Long MRN: [id number] DOB/AGE: 80/31/1942 80 y.o.  Admit date: 10/25/2019 Discharge date: 10/27/2019  Primary Diagnosis: Left Patella fracture  Admission Diagnoses:  Past Medical History:  Diagnosis Date  . Anemia   . Arthritis   . Biliary acute pancreatitis 2006   s/p ERCP with stent, sphincterotomy, stone extraction, subsequent stent removal  several weeks later  . Hypertension   . Kidney anomaly, congenital    solitary left kidney  . Myocardial infarction ([place])   . PE (pulmonary embolism)   . Peripheral vascular disease ([place])    blood clots in legs  . Schizophrenia ([place])    pt denies   Discharge Diagnoses:   Active Problems:   Displaced transverse fracture of left patella, initial encounter for closed fracture   Left patella fracture  Estimated body mass index is 20.12 kg/m as calculated from the following:   Height as of this encounter: 5\' 2"  (1.575 m).   Weight as of this encounter: 49.9 kg.  Procedure:  Procedure(s) (LRB): OPEN REDUCTION INTERNAL (ORIF) FIXATION LEFT PATELLA (Left)   Consults: None  HPI: [name] Long is an 78 year old female sustained a left transverse patella fracture with loss of extensor mechanism.  She was admitted to the hospital for operative management open reduction internal fixation. Laboratory Data: Admission on 10/25/2019, Discharged on 10/27/2019  Component Date Value Ref Range Status  . Sodium 10/25/2019 137  135 - 145 mmol/L Final  . Potassium 10/25/2019 3.6  3.5 - 5.1 mmol/L Final  . Chloride 10/25/2019 102  98 - 111 mmol/L Final  . CO2 10/25/2019 23  22 - 32 mmol/L Final  . Glucose, Bld 10/25/2019 114* 70 - 99 mg/dL Final  . BUN 10/25/2019 14  8 - 23 mg/dL Final  . Creatinine, Ser 10/25/2019 0.84  0.44 - 1.00 mg/dL Final  . Calcium 10/25/2019 8.9  8.9 - 10.3 mg/dL Final  . GFR calc non Af Amer 10/25/2019 >60  >60 mL/min Final  . GFR calc Af Amer 10/25/2019 >60  >60 mL/min Final  . Anion gap 10/25/2019 12   5 - 15 Final   Performed at [facility], [place] [address], [place], [place] [address]  . WBC 10/25/2019 4.9  4.0 - 10.5 K/uL Final  . RBC 10/25/2019 4.30  3.87 - 5.11 MIL/uL Final  . Hemoglobin 10/25/2019 13.2  12.0 - 15.0 g/dL Final  . HCT 10/25/2019 41.2  36.0 - 46.0 % Final  . MCV 10/25/2019 95.8  80.0 - 100.0 fL Final  . MCH 10/25/2019 30.7  26.0 - 34.0 pg Final  . MCHC 10/25/2019 32.0  30.0 - 36.0 g/dL Final  . RDW 10/25/2019 11.9  11.5 - 15.5 % Final  . Platelets 10/25/2019 184  150 - 400 K/uL Final  . nRBC 10/25/2019 0.0  0.0 - 0.2 % Final   Performed at [facility], [place] [address] [place], [place] [address]  . Prothrombin Time 10/25/2019 13.5  11.4 - 15.2 seconds Final  . INR 10/25/2019 1.0  0.8 - 1.2 Final   Comment: (NOTE) INR goal varies based on device and disease states. Performed at [facility], [place] [address] [place], [place] [address]   Hospital Outpatient Visit on 10/21/2019  Component Date Value Ref Range Status  . SARS Coronavirus 2 10/21/2019 NEGATIVE  NEGATIVE Final   Comment: (NOTE) SARS-CoV-2 target nucleic acids are NOT DETECTED. The SARS-CoV-2 RNA is generally detectable in upper and lower respiratory specimens during the  acute phase of infection. Negative results do not preclude SARS-CoV-2 infection, do not rule out co-infections with other pathogens, and should not be used as the sole basis for treatment or other patient management decisions. Negative results must be combined with clinical observations, patient history, and epidemiological information. The expected result is Negative. Fact Sheet for Patients: HairSlick.nohttps://www.fda.gov/media/138098/download Fact Sheet for Healthcare Providers: quierodirigir.comhttps://www.fda.gov/media/138095/download This test is not yet approved or cleared by the [place] FDA and  has been authorized for detection and/or diagnosis of SARS-CoV-2 by FDA under an Emergency Use Authorization (EUA). This EUA will  remain  in effect (meaning this test can be used) for the duration of the COVID-19 declaration under Section 56                          4(b)(1) of the Act, 21 U.S.C. section 360bbb-3(b)(1), unless the authorization is terminated or revoked sooner. Performed at [facility], [address] [place], [place] [address]      X-Rays:DG Knee 1-2 Views Left  Result Date: 10/25/2019 CLINICAL DATA:  ORIF left patella Radiation Safety Timeout performed by TMS LMP verified prior to exam, TMS Tech wore surgical mask/unable to visualize ptSurgery elective EXAM: LEFT KNEE - 1-2 VIEW; DG C-ARM 1-60 MIN COMPARISON:  10/13/2019 FINDINGS: Patient has undergone ORIF of the patella with 2 cortical screws. No new fracture. IMPRESSION: ORIF of the patella. Electronically Signed   By: Norva PavlovElizabeth  Brown M.D.   On: 10/25/2019 15:37   DG Knee Complete 4 Views Left  Result Date: 10/13/2019 CLINICAL DATA:  LEFT knee pain fall. EXAM: LEFT KNEE - COMPLETE 4+ VIEW COMPARISON:  None. FINDINGS: There is a horizontal fracture through the mid patella. Suprapatellar joint effusion noted. No fracture of the tibial plateau. Femur fracture IMPRESSION: Horizontal fracture through the patella. Joint effusion Electronically Signed   By: Genevive BiStewart  Edmunds M.D.   On: 10/13/2019 07:48   DG C-Arm 1-60 Min  Result Date: 10/25/2019 CLINICAL DATA:  ORIF left patella Radiation Safety Timeout performed by TMS LMP verified prior to exam, TMS Tech wore surgical mask/unable to visualize ptSurgery elective EXAM: LEFT KNEE - 1-2 VIEW; DG C-ARM 1-60 MIN COMPARISON:  10/13/2019 FINDINGS: Patient has undergone ORIF of the patella with 2 cortical screws. No new fracture. IMPRESSION: ORIF of the patella. Electronically Signed   By: Norva PavlovElizabeth  Brown M.D.   On: 10/25/2019 15:37    EKG: Orders placed or performed during the hospital encounter of 10/25/19  . EKG 12-Lead  . EKG 12-Lead     Hospital Course: [name] Long is a 78 y.o. who was  admitted to Hospital. They were brought to the operating room on 10/25/2019 and underwent Procedure(s): OPEN REDUCTION INTERNAL (ORIF) FIXATION LEFT PATELLA.  Patient tolerated the procedure well and was later transferred to the recovery room and then to the orthopaedic floor for postoperative care.  They were given PO and IV analgesics for pain control following their surgery.  They were given 24 hours of postoperative antibiotics of  Anti-infectives (From admission, onward)   Start     Dose/Rate Route Frequency Ordered Stop   10/25/19 1200  ceFAZolin (ANCEF) IVPB 2g/100 mL premix     2 g 200 mL/hr over 30 Minutes Intravenous On call to O.R. 10/25/19 1147 10/25/19 1439     and started on DVT prophylaxis in the form of Xarelto.   PT was ordered.  Discharge planning consulted to help with postop disposition  and equipment needs.  Patient had a good night on the evening of surgery.  They started to get up OOB with therapy on day one. Hemovac drain was pulled without difficulty.  By day two,  the patient had progressed with therapy and meeting their goals.  Incision was healing well.  Patient was seen in rounds and was ready to go home.   Diet: Regular diet Activity:PWB Follow-up:in 2 weeks Disposition - Home Discharged Condition: good   Discharge Instructions    Call MD / Call 911   Complete by: As directed    If you experience chest pain or shortness of breath, CALL 911 and be transported to the hospital emergency room.  If you develope a fever above 101 F, pus (white drainage) or increased drainage or redness at the wound, or calf pain, call your surgeon's office.   Constipation Prevention   Complete by: As directed    Drink plenty of fluids.  Prune juice may be helpful.  You may use a stool softener, such as Colace (over the counter) 100 mg twice a day.  Use MiraLax (over the counter) for constipation as needed.   Diet - low sodium heart healthy   Complete by: As directed    Increase  activity slowly as tolerated   Complete by: As directed      Allergies as of 10/27/2019   No Known Allergies     Medication List    TAKE these medications   HYDROcodone-acetaminophen 5-325 MG tablet Commonly known as: Norco Take 1 tablet by mouth every 4 (four) hours as needed for moderate pain.   metoprolol tartrate 25 MG tablet Commonly known as: LOPRESSOR Take 12.5 mg by mouth 2 (two) times daily.   ondansetron 4 MG disintegrating tablet Commonly known as: Zofran ODT Take 1 tablet (4 mg total) by mouth every 8 (eight) hours as needed.   traMADol 50 MG tablet Commonly known as: ULTRAM Take 1 tablet (50 mg total) by mouth every 6 (six) hours as needed. What changed: reasons to take this   Xarelto 20 MG Tabs tablet Generic drug: rivaroxaban Take 20 mg by mouth daily. Notes to patient: Resume home regimen      Follow-up Information    Yolonda Kida, MD In 2 weeks.   Specialty: Orthopedic Surgery Why: For wound re-check Contact information: [address] [place] [address] [phone]        [facility] Follow up.   Why: youth rolling walker Contact information: [address] [place] [address] [phone]        [facility] Follow up.   Specialty: Home Health Services Why: Physical and occupational therapy and Home Health aide Contact information: [address] [place] [address] [phone]           Signed: Maryan Rued, MD Orthopaedic Surgery 10/28/2019, 1:58 PM

## 2019-10-29 ENCOUNTER — Emergency Department (HOSPITAL_COMMUNITY): Payer: Medicare Other

## 2019-10-29 ENCOUNTER — Other Ambulatory Visit: Payer: Self-pay

## 2019-10-29 ENCOUNTER — Encounter (HOSPITAL_COMMUNITY): Payer: Self-pay | Admitting: *Deleted

## 2019-10-29 ENCOUNTER — Encounter (HOSPITAL_COMMUNITY): Payer: Self-pay | Admitting: Emergency Medicine

## 2019-10-29 ENCOUNTER — Inpatient Hospital Stay (HOSPITAL_COMMUNITY)
Admission: EM | Admit: 2019-10-29 | Discharge: 2019-11-03 | DRG: 689 | Disposition: A | Discharge status: Skilled Nursing Facility | Payer: Medicare Other | Attending: Pulmonary Disease | Admitting: Pulmonary Disease

## 2019-10-29 ENCOUNTER — Emergency Department (HOSPITAL_COMMUNITY)
Admission: EM | Admit: 2019-10-29 | Discharge: 2019-10-29 | Disposition: A | Discharge status: Home / Self Care | Payer: Medicare Other | Source: Home / Self Care | Attending: Emergency Medicine | Admitting: Emergency Medicine

## 2019-10-29 DIAGNOSIS — I252 Old myocardial infarction: Secondary | ICD-10-CM

## 2019-10-29 DIAGNOSIS — I10 Essential (primary) hypertension: Secondary | ICD-10-CM | POA: Insufficient documentation

## 2019-10-29 DIAGNOSIS — R0602 Shortness of breath: Secondary | ICD-10-CM

## 2019-10-29 DIAGNOSIS — E872 Acidosis: Secondary | ICD-10-CM | POA: Diagnosis present

## 2019-10-29 DIAGNOSIS — N390 Urinary tract infection, site not specified: Secondary | ICD-10-CM | POA: Diagnosis present

## 2019-10-29 DIAGNOSIS — Z79899 Other long term (current) drug therapy: Secondary | ICD-10-CM | POA: Insufficient documentation

## 2019-10-29 DIAGNOSIS — R531 Weakness: Secondary | ICD-10-CM

## 2019-10-29 DIAGNOSIS — I2699 Other pulmonary embolism without acute cor pulmonale: Secondary | ICD-10-CM | POA: Insufficient documentation

## 2019-10-29 DIAGNOSIS — M1990 Unspecified osteoarthritis, unspecified site: Secondary | ICD-10-CM | POA: Diagnosis present

## 2019-10-29 DIAGNOSIS — Z7901 Long term (current) use of anticoagulants: Secondary | ICD-10-CM | POA: Insufficient documentation

## 2019-10-29 DIAGNOSIS — Z86718 Personal history of other venous thrombosis and embolism: Secondary | ICD-10-CM

## 2019-10-29 DIAGNOSIS — R410 Disorientation, unspecified: Secondary | ICD-10-CM | POA: Insufficient documentation

## 2019-10-29 DIAGNOSIS — U071 COVID-19: Secondary | ICD-10-CM | POA: Diagnosis present

## 2019-10-29 DIAGNOSIS — I739 Peripheral vascular disease, unspecified: Secondary | ICD-10-CM | POA: Diagnosis present

## 2019-10-29 DIAGNOSIS — Z86711 Personal history of pulmonary embolism: Secondary | ICD-10-CM

## 2019-10-29 DIAGNOSIS — N179 Acute kidney failure, unspecified: Secondary | ICD-10-CM | POA: Diagnosis present

## 2019-10-29 DIAGNOSIS — R404 Transient alteration of awareness: Secondary | ICD-10-CM

## 2019-10-29 DIAGNOSIS — R4182 Altered mental status, unspecified: Secondary | ICD-10-CM | POA: Diagnosis present

## 2019-10-29 DIAGNOSIS — W19XXXD Unspecified fall, subsequent encounter: Secondary | ICD-10-CM | POA: Diagnosis present

## 2019-10-29 DIAGNOSIS — S82032D Displaced transverse fracture of left patella, subsequent encounter for closed fracture with routine healing: Secondary | ICD-10-CM

## 2019-10-29 DIAGNOSIS — B9620 Unspecified Escherichia coli [E. coli] as the cause of diseases classified elsewhere: Secondary | ICD-10-CM | POA: Diagnosis present

## 2019-10-29 DIAGNOSIS — I471 Supraventricular tachycardia: Secondary | ICD-10-CM | POA: Diagnosis not present

## 2019-10-29 LAB — CBC WITH DIFFERENTIAL/PLATELET
Abs Immature Granulocytes: 0.04 10*3/uL (ref 0.00–0.07)
Basophils Absolute: 0 10*3/uL (ref 0.0–0.1)
Basophils Relative: 0 %
Eosinophils Absolute: 0 10*3/uL (ref 0.0–0.5)
Eosinophils Relative: 0 %
HCT: 39.9 % (ref 36.0–46.0)
Hemoglobin: 12.7 g/dL (ref 12.0–15.0)
Immature Granulocytes: 1 %
Lymphocytes Relative: 28 %
Lymphs Abs: 1.9 10*3/uL (ref 0.7–4.0)
MCH: 30 pg (ref 26.0–34.0)
MCHC: 31.8 g/dL (ref 30.0–36.0)
MCV: 94.1 fL (ref 80.0–100.0)
Monocytes Absolute: 0.8 10*3/uL (ref 0.1–1.0)
Monocytes Relative: 12 %
Neutro Abs: 4.1 10*3/uL (ref 1.7–7.7)
Neutrophils Relative %: 59 %
Platelets: 236 10*3/uL (ref 150–400)
RBC: 4.24 MIL/uL (ref 3.87–5.11)
RDW: 11.9 % (ref 11.5–15.5)
WBC: 6.8 10*3/uL (ref 4.0–10.5)
nRBC: 0 % (ref 0.0–0.2)

## 2019-10-29 LAB — COMPREHENSIVE METABOLIC PANEL
ALT: 18 U/L (ref 0–44)
AST: 33 U/L (ref 15–41)
Albumin: 3.1 g/dL — ABNORMAL LOW (ref 3.5–5.0)
Alkaline Phosphatase: 52 U/L (ref 38–126)
Anion gap: 16 — ABNORMAL HIGH (ref 5–15)
BUN: 15 mg/dL (ref 8–23)
CO2: 17 mmol/L — ABNORMAL LOW (ref 22–32)
Calcium: 8.7 mg/dL — ABNORMAL LOW (ref 8.9–10.3)
Chloride: 98 mmol/L (ref 98–111)
Creatinine, Ser: 1.18 mg/dL — ABNORMAL HIGH (ref 0.44–1.00)
GFR calc Af Amer: 50 mL/min — ABNORMAL LOW (ref >60)
GFR calc non Af Amer: 44 mL/min — ABNORMAL LOW (ref >60)
Glucose, Bld: 166 mg/dL — ABNORMAL HIGH (ref 70–99)
Potassium: 3.5 mmol/L (ref 3.5–5.1)
Sodium: 131 mmol/L — ABNORMAL LOW (ref 135–145)
Total Bilirubin: 1.2 mg/dL (ref 0.3–1.2)
Total Protein: 7.2 g/dL (ref 6.5–8.1)

## 2019-10-29 LAB — LACTIC ACID, PLASMA: Lactic Acid, Venous: 3.4 mmol/L (ref 0.5–1.9)

## 2019-10-29 MED ORDER — SODIUM CHLORIDE 0.9 % IV BOLUS
500.0000 mL | Freq: Once | INTRAVENOUS | Status: AC
  Administered 2019-10-30: 500 mL via INTRAVENOUS

## 2019-10-29 NOTE — ED Provider Notes (Signed)
[facility] Provider Note   CSN: [id number] Arrival date & time: 10/29/19  1226     History Chief Complaint  Patient presents with  . Evaluation for assited living placement    [name] Long is a 78 y.o. female.  Patient with a history of left knee surgery approximate 2 weeks ago presents from her home.  Patient lives independently.  She was being visited by her home health aide who said that she had a period of confusion and felt that the patient was unsafe to be at home alone.  Patient was in transmitted to the ED via EMS.  Patient says that she feels fine.  Denies any confusion.  Says that she lives independently and that her only problem is that she has poor appetite.  She does admit to weakness and difficulty mobilizing due to her leg, but she says she is able to ambulate with walker.  Patient has a son, Herbie Baltimore, who is retired.  He is on the phone.  Talk with him guarding the patient's case, he feels that he can be with her and provide care.  He is retired.  He is reach out to their regular doctor, Dr. Luan Pulling.  He was already ordered a home health order.  Unfortunately this is still in process.  Patient and son would like patient to go home.   The history is provided by the patient and a relative.       Past Medical History:  Diagnosis Date  . Anemia   . Arthritis   . Biliary acute pancreatitis 2006   s/p ERCP with stent, sphincterotomy, stone extraction, subsequent stent removal  several weeks later  . Hypertension   . Kidney anomaly, congenital    solitary left kidney  . Myocardial infarction ([place])   . PE (pulmonary embolism)   . Peripheral vascular disease ([place])    blood clots in legs  . Schizophrenia ([place])    pt denies    Patient Active Problem List   Diagnosis Date Noted  . Displaced transverse fracture of left patella, initial encounter for closed fracture 10/25/2019  . Left patella fracture 10/25/2019  . Screening for colon cancer  10/02/2011    Past Surgical History:  Procedure Laterality Date  . ABDOMINAL HYSTERECTOMY    . CHOLECYSTECTOMY    . COLONOSCOPY    . KNEE SURGERY Left   . ORIF PATELLA Left 10/25/2019   Procedure: OPEN REDUCTION INTERNAL (ORIF) FIXATION LEFT PATELLA;  Surgeon: Nicholes Stairs, MD;  Location: [facility];  Service: Orthopedics;  Laterality: Left;  171mins  . UMBILICAL HERNIA REPAIR       OB History   No obstetric history on file.     Family History  Problem Relation Age of Onset  . Colon cancer Other        Mother, Brother, unsure age    Social History   Tobacco Use  . Smoking status: Never Smoker  . Smokeless tobacco: Never Used  Substance Use Topics  . Alcohol use: Not Currently    Comment: heavy drinker in the past  . Drug use: No    Home Medications Prior to Admission medications   Medication Sig Start Date End Date Taking? Authorizing Provider  HYDROcodone-acetaminophen (NORCO) 5-325 MG tablet Take 1 tablet by mouth every 4 (four) hours as needed for moderate pain. 10/27/19   Nicholes Stairs, MD  metoprolol tartrate (LOPRESSOR) 25 MG tablet Take 12.5 mg by mouth 2 (two) times daily.  [provider]  ondansetron (ZOFRAN ODT) 4 MG disintegrating tablet Take 1 tablet (4 mg total) by mouth every 8 (eight) hours as needed. 10/27/19   Yolonda Kidaogers, Jason Patrick, MD  traMADol (ULTRAM) 50 MG tablet Take 1 tablet (50 mg total) by mouth every 6 (six) hours as needed. Patient taking differently: Take 50 mg by mouth every 6 (six) hours as needed for moderate pain.  10/13/19   Mancel BaleWentz, Elliott, MD  XARELTO 20 MG TABS tablet Take 20 mg by mouth daily. 10/10/19   [provider]    Allergies    Patient has no known allergies.  Review of Systems   Review of Systems Denies denies worsening weakness, leg pain, confusion, fevers, chills, shortness of breath, chest pain Physical Exam Updated Vital Signs BP (!) 135/98 (BP Location: Right Arm)   Pulse 91    Temp 98.2 F (36.8 C) (Oral)   Resp 20   Ht 5\' 2"  (1.575 m)   Wt 49.9 kg   SpO2 97%   BMI 20.12 kg/m   Physical Exam Vitals reviewed.  Constitutional:      General: She is not in acute distress.    Comments: Thin frame  HENT:     Head: Normocephalic and atraumatic.     Nose: Nose normal.     Mouth/Throat:     Mouth: Mucous membranes are moist.     Comments: Missing multiple teeth, some caries. Eyes:     Extraocular Movements: Extraocular movements intact.     Pupils: Pupils are equal, round, and reactive to light.  Cardiovascular:     Rate and Rhythm: Normal rate and regular rhythm.  Pulmonary:     Effort: Pulmonary effort is normal.     Breath sounds: Normal breath sounds.  Abdominal:     General: Abdomen is flat. There is no distension.     Tenderness: There is no abdominal tenderness.  Musculoskeletal:     Cervical back: Neck supple.     Comments: Left leg is immobilized from the knee  Skin:    General: Skin is warm and dry.  Neurological:     General: No focal deficit present.     Mental Status: She is alert and oriented to person, place, and time. Mental status is at baseline.     Cranial Nerves: No cranial nerve deficit.     Motor: No weakness.  Psychiatric:        Mood and Affect: Mood normal.        Behavior: Behavior normal.     ED Results / Procedures / Treatments   Labs (all labs ordered are listed, but only abnormal results are displayed) Labs Reviewed - No data to display  EKG None  Radiology No results found.  Procedures Procedures (including critical care time)  Medications Ordered in ED Medications - No data to display  ED Course  I have reviewed the triage vital signs and the nursing notes.  Pertinent labs & imaging results that were available during my care of the patient were reviewed by me and considered in my medical decision making (see chart for details).    MDM Rules/Calculators/A&P     CHA2DS2/VAS Stroke Risk Points       N/A >= 2 Points: High Risk  1 - 1.99 Points: Medium Risk  0 Points: Low Risk    A final score could not be computed because of missing components.: Last  Change: N/A     This score determines the patient's risk  of having a stroke if the  patient has atrial fibrillation.      This score is not applicable to this patient. Components are not  calculated.                   Patient sent from home due to concern for confusion and unsafe disposition at home.  Patient well-appearing and alert and oriented x3.  Spoke with patient's son Molly Maduro, this is a can provide 24-hour supervision while patient is getting additional home health services.  Patient is somewhat home.  I agree with this as well as admission to the hospital for social disposition during the Covid 19 pandemic is probably not the safest plan for the patient.  Will place order to case management and home health to help expedite this process.   Final Clinical Impression(s) / ED Diagnoses Final diagnoses:  Confusion  Weakness    Rx / DC Orders ED Discharge Orders    None       Garnette Gunner, MD 10/29/19 Milas Hock, MD 10/30/19 [phone]

## 2019-10-29 NOTE — ED Notes (Signed)
Patient transported to CT 

## 2019-10-29 NOTE — Discharge Instructions (Signed)
You came to the emergency department because your home health aide that you were not safe at home.  We recommend that you have 24-hour supervision.  Your son [name] Long said that he could provide this for you.  We have also put a referral into case management and home health to provide you with additional services.  Please also follow-up with your doctor to ensure that you have access to these as well.  Come back to the emergency department if you develop any weakness, pain, confusion or any other worrisome symptom.

## 2019-10-29 NOTE — ED Triage Notes (Addendum)
[facility] called out for confusion for this pt.  Pt just discharged from ED for the same complaint.  Reported that pt's son stated that Pt was 'acting different".  Reported that pt alert and oriented to day and month. cbg 149 and manuel BP 90/60.

## 2019-10-29 NOTE — ED Provider Notes (Signed)
[facility] EMERGENCY DEPARTMENT Provider Note   CSN: [id number] Arrival date & time: 10/29/19  2216     History Chief Complaint  Patient presents with  . Altered Mental Status    [name] Long is a 78 y.o. female.  Patient presents back to the ED after recent discharge for concern for altered mental status.  Although seemingly well-appearing at prior ED visit, patient had only been at her son's house for approximate 10 minutes when he said that she became confused and incontinent.  The son said that she did not recognize her him or know what was going on.  Patient states that he became concerned and felt that he could not take care of his mother safely.  Patient's son then called EMS to have patient reevaluated in the ED.  Of note patient was recently discharged from the hospital for a left knee surgery status post fall.  Please see prior ED note for further history.  Patient right now says that she feels like she is back to black out and that she is short of breath.  Denies any chest pain.  Patient son states that he does not feel that he can take care of her due to this acute change.        Past Medical History:  Diagnosis Date  . Anemia   . Arthritis   . Biliary acute pancreatitis 2006   s/p ERCP with stent, sphincterotomy, stone extraction, subsequent stent removal  several weeks later  . Hypertension   . Kidney anomaly, congenital    solitary left kidney  . Myocardial infarction ([facility])   . PE (pulmonary embolism)   . Peripheral vascular disease ([facility])    blood clots in legs  . Schizophrenia ([facility])    pt denies    Patient Active Problem List   Diagnosis Date Noted  . Displaced transverse fracture of left patella, initial encounter for closed fracture 10/25/2019  . Left patella fracture 10/25/2019  . Screening for colon cancer 10/02/2011    Past Surgical History:  Procedure Laterality Date  . ABDOMINAL HYSTERECTOMY    . CHOLECYSTECTOMY    . COLONOSCOPY    . KNEE  SURGERY Left   . ORIF PATELLA Left 10/25/2019   Procedure: OPEN REDUCTION INTERNAL (ORIF) FIXATION LEFT PATELLA;  Surgeon: Yolonda Kidaogers, Jason Patrick, MD;  [facility];  Service: Orthopedics;  Laterality: Left;  120mins  . UMBILICAL HERNIA REPAIR       OB History   No obstetric history on file.     Family History  Problem Relation Age of Onset  . Colon cancer Other        Mother, Brother, unsure age    Social History   Tobacco Use  . Smoking status: Never Smoker  . Smokeless tobacco: Never Used  Substance Use Topics  . Alcohol use: Not Currently    Comment: heavy drinker in the past  . Drug use: No    Home Medications Prior to Admission medications   Medication Sig Start Date End Date Taking? Authorizing Provider  HYDROcodone-acetaminophen (NORCO) 5-325 MG tablet Take 1 tablet by mouth every 4 (four) hours as needed for moderate pain. 10/27/19   Yolonda Kidaogers, Jason Patrick, MD  metoprolol tartrate (LOPRESSOR) 25 MG tablet Take 12.5 mg by mouth 2 (two) times daily.     [provider]  ondansetron (ZOFRAN ODT) 4 MG disintegrating tablet Take 1 tablet (4 mg total) by mouth every 8 (eight) hours as needed. 10/27/19   Aundria Rudogers,  Noah Delaine, MD  traMADol (ULTRAM) 50 MG tablet Take 1 tablet (50 mg total) by mouth every 6 (six) hours as needed. Patient taking differently: Take 50 mg by mouth every 6 (six) hours as needed for moderate pain.  10/13/19   Mancel Bale, MD  XARELTO 20 MG TABS tablet Take 20 mg by mouth daily. 10/10/19   [provider]    Allergies    Patient has no known allergies.  Review of Systems   Review of Systems Shortness of breath Denies chest pain Physical Exam Updated Vital Signs BP 99/64 (BP Location: Right Arm)   Pulse 75   Resp 16   SpO2 97%   Physical Exam Constitutional:      General: She is in acute distress.     Appearance: She is not diaphoretic.  HENT:     Head: Normocephalic and atraumatic.     Nose: Nose normal.      Mouth/Throat:     Mouth: Mucous membranes are moist.  Eyes:     Extraocular Movements: Extraocular movements intact.     Pupils: Pupils are equal, round, and reactive to light.  Cardiovascular:     Rate and Rhythm: Normal rate and regular rhythm.  Pulmonary:     Effort: Pulmonary effort is normal.     Breath sounds: Normal breath sounds.  Abdominal:     General: Abdomen is flat. There is no distension.  Musculoskeletal:     Cervical back: No tenderness.     Comments: Left leg immobilized at the knee, can move lower extremities without issue  Skin:    General: Skin is warm and dry.  Neurological:     General: No focal deficit present.     Mental Status: She is alert and oriented to person, place, and time. Mental status is at baseline.     Cranial Nerves: No cranial nerve deficit.     Motor: No weakness.  Psychiatric:        Mood and Affect: Mood normal.        Behavior: Behavior normal.     ED Results / Procedures / Treatments   Labs (all labs ordered are listed, but only abnormal results are displayed) Labs Reviewed  COMPREHENSIVE METABOLIC PANEL - Abnormal; Notable for the following components:      Result Value   Sodium 131 (*)    CO2 17 (*)    Glucose, Bld 166 (*)    Creatinine, Ser 1.18 (*)    Calcium 8.7 (*)    Albumin 3.1 (*)    GFR calc non Af Amer 44 (*)    GFR calc Af Amer 50 (*)    Anion gap 16 (*)    All other components within normal limits  LACTIC ACID, PLASMA - Abnormal; Notable for the following components:   Lactic Acid, Venous 3.4 (*)    All other components within normal limits  URINE CULTURE  SARS CORONAVIRUS 2 (TAT 6-24 HRS)  CULTURE, BLOOD (ROUTINE X 2)  CULTURE, BLOOD (ROUTINE X 2)  CBC WITH DIFFERENTIAL/PLATELET  LACTIC ACID, PLASMA  URINALYSIS, ROUTINE W REFLEX MICROSCOPIC  TROPONIN I (HIGH SENSITIVITY)    EKG None  Radiology CT Head Wo Contrast  Result Date: 10/29/2019 CLINICAL DATA:  Confusion EXAM: CT HEAD WITHOUT CONTRAST  TECHNIQUE: Contiguous axial images were obtained from the base of the skull through the vertex without intravenous contrast. COMPARISON:  09/26/2016 FINDINGS: Brain: Mild atrophic changes are noted commenced with the patient's given age. No findings  to suggest acute hemorrhage or space-occupying mass lesion are noted. Scattered white matter ischemic changes are noted. Vascular: No hyperdense vessel or unexpected calcification. Skull: Normal. Negative for fracture or focal lesion. Sinuses/Orbits: No acute finding. Other: None. IMPRESSION: Mild atrophic and ischemic changes without acute abnormality. Electronically Signed   By: Inez Catalina M.D.   On: 10/29/2019 23:37   DG Chest Port 1 View  Result Date: 10/29/2019 CLINICAL DATA:  Confusion and shortness of breath EXAM: PORTABLE CHEST 1 VIEW COMPARISON:  12/01/17 FINDINGS: Cardiac shadow is stable. Aortic calcifications are again seen. The lungs are well aerated bilaterally. No focal infiltrate or sizable effusion is seen. Degenerative changes of the shoulder joints are noted bilaterally. IMPRESSION: No acute abnormality noted. Aortic Atherosclerosis (ICD10-I70.0). Electronically Signed   By: Inez Catalina M.D.   On: 10/29/2019 23:38    Procedures Procedures (including critical care time)  Medications Ordered in ED Medications  sodium chloride 0.9 % bolus 500 mL (has no administration in time range)    ED Course  I have reviewed the triage vital signs and the nursing notes.  Pertinent labs & imaging results that were available during my care of the patient were reviewed by me and considered in my medical decision making (see chart for details).  Clinical Course as of Oct 29 45  Sat Oct 29, 2019  2345 Lactic Acid, Venous(!!): 3.4 [AT]  2345 Lactic Acid, Venous(!!): 3.4 [AT]  2345 CO2(!): 17 [AT]  Sun Oct 30, 2019  0044 BP: 99/64 [AT]  0044 Lactic Acid, Venous(!!): 3.4 [AT]  0045 BP: 99/64 [AT]    Clinical Course User Index [AT] Bonnita Hollow, MD   MDM Rules/Calculators/A&P     CHA2DS2/VAS Stroke Risk Points      N/A >= 2 Points: High Risk  1 - 1.99 Points: Medium Risk  0 Points: Low Risk    A final score could not be computed because of missing components.: Last  Change: N/A     This score determines the patient's risk of having a stroke if the  patient has atrial fibrillation.      This score is not applicable to this patient. Components are not  calculated.                   Patient presenting back to the ED for altered mental status and shortness of breath.  This is acute change from earlier.  Will do broad work-up including CT head, UA, CMP, CBC, EKG, urine culture, lactate, chest x-ray, orthostatics, COVID-19, troponin.  Will start gentle IV fluids.  Update CT head negative for stroke.  Chest x-ray negative for acute cardiopulmonary issues.  Elevated lactate 3.4, mild hyponatremia 450, metabolic acidosis 17 with anion gap 16.  Have ordered blood cultures.  Possible infection, source is unknown at this moment. Will page hospitalist for admission.  Update Spoke with Dr. Darrick Meigs, [facility] who plans to admit patient for observation.    Final Clinical Impression(s) / ED Diagnoses Final diagnoses:  Transient alteration of awareness    Rx / DC Orders ED Discharge Orders    None       Bonnita Hollow, MD 10/30/19 0047    Maudie Flakes, MD 10/30/19 [phone]

## 2019-10-29 NOTE — ED Triage Notes (Signed)
Patient had left knee surgery x2 weeks ago and is currently living by herself. Home Health Care nurse came by to check on patient today and states that patient is unable to care for herself due to ambulation issues and was requesting patient be evaluated for Assisted Living placement.

## 2019-10-30 DIAGNOSIS — Z7901 Long term (current) use of anticoagulants: Secondary | ICD-10-CM | POA: Diagnosis not present

## 2019-10-30 DIAGNOSIS — U071 COVID-19: Secondary | ICD-10-CM | POA: Diagnosis not present

## 2019-10-30 DIAGNOSIS — B9620 Unspecified Escherichia coli [E. coli] as the cause of diseases classified elsewhere: Secondary | ICD-10-CM | POA: Diagnosis present

## 2019-10-30 DIAGNOSIS — M1990 Unspecified osteoarthritis, unspecified site: Secondary | ICD-10-CM | POA: Diagnosis present

## 2019-10-30 DIAGNOSIS — R404 Transient alteration of awareness: Secondary | ICD-10-CM

## 2019-10-30 DIAGNOSIS — S82002D Unspecified fracture of left patella, subsequent encounter for closed fracture with routine healing: Secondary | ICD-10-CM | POA: Diagnosis not present

## 2019-10-30 DIAGNOSIS — Z86711 Personal history of pulmonary embolism: Secondary | ICD-10-CM | POA: Diagnosis not present

## 2019-10-30 DIAGNOSIS — N390 Urinary tract infection, site not specified: Secondary | ICD-10-CM | POA: Diagnosis not present

## 2019-10-30 DIAGNOSIS — I252 Old myocardial infarction: Secondary | ICD-10-CM | POA: Diagnosis not present

## 2019-10-30 DIAGNOSIS — I471 Supraventricular tachycardia: Secondary | ICD-10-CM | POA: Diagnosis not present

## 2019-10-30 DIAGNOSIS — Z7401 Bed confinement status: Secondary | ICD-10-CM | POA: Diagnosis not present

## 2019-10-30 DIAGNOSIS — R4182 Altered mental status, unspecified: Secondary | ICD-10-CM

## 2019-10-30 DIAGNOSIS — W19XXXD Unspecified fall, subsequent encounter: Secondary | ICD-10-CM | POA: Diagnosis present

## 2019-10-30 DIAGNOSIS — I739 Peripheral vascular disease, unspecified: Secondary | ICD-10-CM | POA: Diagnosis present

## 2019-10-30 DIAGNOSIS — N179 Acute kidney failure, unspecified: Secondary | ICD-10-CM | POA: Diagnosis present

## 2019-10-30 DIAGNOSIS — R0902 Hypoxemia: Secondary | ICD-10-CM | POA: Diagnosis not present

## 2019-10-30 DIAGNOSIS — E872 Acidosis: Secondary | ICD-10-CM | POA: Diagnosis present

## 2019-10-30 DIAGNOSIS — Z4789 Encounter for other orthopedic aftercare: Secondary | ICD-10-CM | POA: Diagnosis not present

## 2019-10-30 DIAGNOSIS — S82032D Displaced transverse fracture of left patella, subsequent encounter for closed fracture with routine healing: Secondary | ICD-10-CM | POA: Diagnosis not present

## 2019-10-30 DIAGNOSIS — Z86718 Personal history of other venous thrombosis and embolism: Secondary | ICD-10-CM | POA: Diagnosis not present

## 2019-10-30 DIAGNOSIS — I10 Essential (primary) hypertension: Secondary | ICD-10-CM | POA: Diagnosis not present

## 2019-10-30 DIAGNOSIS — Z743 Need for continuous supervision: Secondary | ICD-10-CM | POA: Diagnosis not present

## 2019-10-30 LAB — URINALYSIS, ROUTINE W REFLEX MICROSCOPIC
Bilirubin Urine: NEGATIVE
Glucose, UA: NEGATIVE mg/dL
Ketones, ur: 20 mg/dL — AB
Nitrite: NEGATIVE
Protein, ur: 30 mg/dL — AB
Specific Gravity, Urine: 1.016 (ref 1.005–1.030)
WBC, UA: >50 WBC/hpf — ABNORMAL HIGH (ref 0–5)
pH: 5 (ref 5.0–8.0)

## 2019-10-30 LAB — CBC
HCT: 35.9 % — ABNORMAL LOW (ref 36.0–46.0)
Hemoglobin: 11.2 g/dL — ABNORMAL LOW (ref 12.0–15.0)
MCH: 29.8 pg (ref 26.0–34.0)
MCHC: 31.2 g/dL (ref 30.0–36.0)
MCV: 95.5 fL (ref 80.0–100.0)
Platelets: 188 10*3/uL (ref 150–400)
RBC: 3.76 MIL/uL — ABNORMAL LOW (ref 3.87–5.11)
RDW: 12 % (ref 11.5–15.5)
WBC: 8.1 10*3/uL (ref 4.0–10.5)
nRBC: 0 % (ref 0.0–0.2)

## 2019-10-30 LAB — COMPREHENSIVE METABOLIC PANEL
ALT: 15 U/L (ref 0–44)
AST: 28 U/L (ref 15–41)
Albumin: 2.9 g/dL — ABNORMAL LOW (ref 3.5–5.0)
Alkaline Phosphatase: 46 U/L (ref 38–126)
Anion gap: 12 (ref 5–15)
BUN: 17 mg/dL (ref 8–23)
CO2: 24 mmol/L (ref 22–32)
Calcium: 8.1 mg/dL — ABNORMAL LOW (ref 8.9–10.3)
Chloride: 98 mmol/L (ref 98–111)
Creatinine, Ser: 1.03 mg/dL — ABNORMAL HIGH (ref 0.44–1.00)
GFR calc Af Amer: 59 mL/min — ABNORMAL LOW (ref >60)
GFR calc non Af Amer: 51 mL/min — ABNORMAL LOW (ref >60)
Glucose, Bld: 101 mg/dL — ABNORMAL HIGH (ref 70–99)
Potassium: 4.5 mmol/L (ref 3.5–5.1)
Sodium: 134 mmol/L — ABNORMAL LOW (ref 135–145)
Total Bilirubin: 1 mg/dL (ref 0.3–1.2)
Total Protein: 6.5 g/dL (ref 6.5–8.1)

## 2019-10-30 LAB — TROPONIN I (HIGH SENSITIVITY)
Troponin I (High Sensitivity): 8 ng/L (ref <18)
Troponin I (High Sensitivity): 9 ng/L (ref <18)

## 2019-10-30 LAB — VITAMIN B12: Vitamin B-12: 122 pg/mL — ABNORMAL LOW (ref 180–914)

## 2019-10-30 LAB — SARS CORONAVIRUS 2 (TAT 6-24 HRS): SARS Coronavirus 2: NEGATIVE

## 2019-10-30 LAB — LACTIC ACID, PLASMA: Lactic Acid, Venous: 2.3 mmol/L (ref 0.5–1.9)

## 2019-10-30 LAB — TSH: TSH: 0.52 u[IU]/mL (ref 0.350–4.500)

## 2019-10-30 MED ORDER — SODIUM CHLORIDE 0.9 % IV SOLN
INTRAVENOUS | Status: DC
  Administered 2019-10-30: 75 mL/h via INTRAVENOUS
  Administered 2019-10-31: 01:00:00 via INTRAVENOUS

## 2019-10-30 MED ORDER — ONDANSETRON HCL 4 MG/2ML IJ SOLN: 4.0000 mg | Freq: Four times a day (QID) | INTRAMUSCULAR | Status: DC | PRN

## 2019-10-30 MED ORDER — METOPROLOL TARTRATE 25 MG PO TABS
12.5000 mg | ORAL_TABLET | Freq: Two times a day (BID) | ORAL | Status: DC
  Administered 2019-10-30 – 2019-11-03 (×9): 12.5 mg via ORAL
  Filled 2019-10-30 (×9): qty 1

## 2019-10-30 MED ORDER — HYDROCODONE-ACETAMINOPHEN 5-325 MG PO TABS
1.0000 | ORAL_TABLET | ORAL | Status: DC | PRN
  Administered 2019-10-30 – 2019-11-01 (×3): 1 via ORAL
  Filled 2019-10-30 (×3): qty 1

## 2019-10-30 MED ORDER — RIVAROXABAN 20 MG PO TABS
20.0000 mg | ORAL_TABLET | Freq: Every day | ORAL | Status: DC
  Administered 2019-10-30 – 2019-11-03 (×5): 20 mg via ORAL
  Filled 2019-10-30 (×5): qty 1

## 2019-10-30 MED ORDER — ONDANSETRON HCL 4 MG PO TABS: 4.0000 mg | ORAL_TABLET | Freq: Four times a day (QID) | ORAL | Status: DC | PRN

## 2019-10-30 NOTE — ED Notes (Signed)
orthostatic vitals signs lying and sitting done. Patient is unsteady on her feet when trying to stand. Unable to obtain standing blood pressure

## 2019-10-30 NOTE — Progress Notes (Signed)
This is an assumption of care note.  She has had episodes of confusion.  She does not have a definite etiology known at this time.  Comprehensive metabolic panel is pending.  I am going to add an ammonia level.  She has history of pulmonary emboli and was off Xarelto following her orthopedic surgery and I do not think it is entirely clear if she has restarted that or not.  It has been restarted now

## 2019-10-30 NOTE — ED Notes (Signed)
Awaiting bed assignment.

## 2019-10-30 NOTE — H&P (Signed)
TRH H&P    Patient Demographics:    [name] Long, is a 78 y.o. female  MRN: [id number]  DOB - 10/01/1941  Admit Date - 10/29/2019  Referring MD/NP/PA: Dr Pilar Plate  Outpatient Primary MD for the patient is Kari Baars, MD  Patient coming from: Home  Chief complaint-altered mental status   HPI:    [name] Long  is a 78 y.o. female, with history of biliary pancreatitis, hypertension, solitary left kidney, pulmonary embolism, peripheral vascular disease who had left knee surgery, ORIF left patella for displaced transverse fracture of patella, 2 weeks ago, initially came to ED in the evening when home health aide said patient had.  Of confusion felt that she was unsafe to be at home alone.  She was transported to ED by EMS.  By the time patient came to ED she was back to her baseline.  Patient was discharged home.  When patient went to her son's house for approximately 10 minutes she became confused and incontinent.  As per son patient became confused and did not recognize him or know what was going on.  She was brought back to the hospital. In the ED, lab work showed lactic acid 3.4, repeat lactic acid 2.3.  BUN 15, creatinine 1.18.  Her baseline creatinine of 0.84.  Sodium 131.  UA was unremarkable.  WBC 6.8.  CT head unremarkable. Patient denies nausea vomiting or diarrhea Denies chest pain or shortness of breath Denies fever abdominal pain Denies dysuria  At this time patient is back to her baseline.  Answering all questions appropriately.    Review of systems:    In addition to the HPI above,   All other systems reviewed and are negative.    Past History of the following :    Past Medical History:  Diagnosis Date  . Anemia   . Arthritis   . Biliary acute pancreatitis 2006   s/p ERCP with stent, sphincterotomy, stone extraction, subsequent stent removal  several weeks later  . Hypertension   .  Kidney anomaly, congenital    solitary left kidney  . Myocardial infarction (HCC)   . PE (pulmonary embolism)   . Peripheral vascular disease (HCC)    blood clots in legs  . Schizophrenia (HCC)    pt denies      Past Surgical History:  Procedure Laterality Date  . ABDOMINAL HYSTERECTOMY    . CHOLECYSTECTOMY    . COLONOSCOPY    . KNEE SURGERY Left   . ORIF PATELLA Left 10/25/2019   Procedure: OPEN REDUCTION INTERNAL (ORIF) FIXATION LEFT PATELLA;  Surgeon: Yolonda Kida, MD;  Location: [facility] OR;  Service: Orthopedics;  Laterality: Left;   . UMBILICAL HERNIA REPAIR        Social History:      Social History   Tobacco Use  . Smoking status: Never Smoker  . Smokeless tobacco: Never Used  Substance Use Topics  . Alcohol use: Not Currently    Comment: heavy drinker in the past       Family History :  Family History  Problem Relation Age of Onset  . Colon cancer Other        Mother, Brother, unsure age      Home Medications:   Prior to Admission medications   Medication Sig Start Date End Date Taking? Authorizing Provider  HYDROcodone-acetaminophen (NORCO) 5-325 MG tablet Take 1 tablet by mouth every 4 (four) hours as needed for moderate pain. 10/27/19   Yolonda Kidaogers, Jason Patrick, MD  metoprolol tartrate (LOPRESSOR) 25 MG tablet Take 12.5 mg by mouth 2 (two) times daily.     [provider]  ondansetron (ZOFRAN ODT) 4 MG disintegrating tablet Take 1 tablet (4 mg total) by mouth every 8 (eight) hours as needed. 10/27/19   Yolonda Kidaogers, Jason Patrick, MD  traMADol (ULTRAM) 50 MG tablet Take 1 tablet (50 mg total) by mouth every 6 (six) hours as needed. Patient taking differently: Take 50 mg by mouth every 6 (six) hours as needed for moderate pain.  10/13/19   Mancel BaleWentz, Elliott, MD  XARELTO 20 MG TABS tablet Take 20 mg by mouth daily. 10/10/19   [provider]     Allergies:    No Known Allergies   Physical Exam:   Vitals  Blood pressure  (!) 127/96, pulse 77, temperature 98.2 F (36.8 C), temperature source Oral, resp. rate (!) 24, SpO2 97 %.  1.  General: Appears in no acute distress  2. Psychiatric: Alert, oriented x3, intact insight and judgment  3. Neurologic: Cranial nerves II through XII grossly intact, motor strength 5/5 in all extremities  4. HEENMT:  Atraumatic normocephalic, extraocular muscles are intact  5. Respiratory : Clear to auscultation bilaterally  6. Cardiovascular : S1-S2, regular, no murmur auscultated  7. Gastrointestinal:  Abdomen is soft, nontender, no organomegaly     Data Review:    CBC Recent Labs  Lab 10/25/19 1151 10/29/19 2306  WBC 4.9 6.8  HGB 13.2 12.7  HCT 41.2 39.9  PLT 184 236  MCV 95.8 94.1  MCH 30.7 30.0  MCHC 32.0 31.8  RDW 11.9 11.9  LYMPHSABS  --  1.9  MONOABS  --  0.8  EOSABS  --  0.0  BASOSABS  --  0.0   ------------------------------------------------------------------------------------------------------------------  Results for orders placed or performed during the hospital encounter of 10/29/19 (from the past 48 hour(s))  CBC with Differential     Status: None   Collection Time: 10/29/19 11:06 PM  Result Value Ref Range   WBC 6.8 4.0 - 10.5 K/uL   RBC 4.24 3.87 - 5.11 MIL/uL   Hemoglobin 12.7 12.0 - 15.0 g/dL   HCT 16.139.9 09.636.0 - 04.546.0 %   MCV 94.1 80.0 - 100.0 fL   MCH 30.0 26.0 - 34.0 pg   MCHC 31.8 30.0 - 36.0 g/dL   RDW 40.911.9 81.111.5 - 91.415.5 %   Platelets 236 150 - 400 K/uL   nRBC 0.0 0.0 - 0.2 %   Neutrophils Relative % 59 %   Neutro Abs 4.1 1.7 - 7.7 K/uL   Lymphocytes Relative 28 %   Lymphs Abs 1.9 0.7 - 4.0 K/uL   Monocytes Relative 12 %   Monocytes Absolute 0.8 0.1 - 1.0 K/uL   Eosinophils Relative 0 %   Eosinophils Absolute 0.0 0.0 - 0.5 K/uL   Basophils Relative 0 %   Basophils Absolute 0.0 0.0 - 0.1 K/uL   Immature Granulocytes 1 %   Abs Immature Granulocytes 0.04 0.00 - 0.07 K/uL    Comment: Performed at [facility],  [address] [place], [place] [address]  Comprehensive metabolic panel     Status: Abnormal   Collection Time: 10/29/19 11:06 PM  Result Value Ref Range   Sodium 131 (L) 135 - 145 mmol/L   Potassium 3.5 3.5 - 5.1 mmol/L   Chloride 98 98 - 111 mmol/L   CO2 17 (L) 22 - 32 mmol/L   Glucose, Bld 166 (H) 70 - 99 mg/dL   BUN 15 8 - 23 mg/dL   Creatinine, Ser 5.62 (H) 0.44 - 1.00 mg/dL   Calcium 8.7 (L) 8.9 - 10.3 mg/dL   Total Protein 7.2 6.5 - 8.1 g/dL   Albumin 3.1 (L) 3.5 - 5.0 g/dL   AST 33 15 - 41 U/L   ALT 18 0 - 44 U/L   Alkaline Phosphatase 52 38 - 126 U/L   Total Bilirubin 1.2 0.3 - 1.2 mg/dL   GFR calc non Af Amer 44 (L) >60 mL/min   GFR calc Af Amer 50 (L) >60 mL/min   Anion gap 16 (H) 5 - 15    Comment: Performed at [facility], [address], [place], [place] [address]  Lactic acid, plasma     Status: Abnormal   Collection Time: 10/29/19 11:06 PM  Result Value Ref Range   Lactic Acid, Venous 3.4 (HH) 0.5 - 1.9 mmol/L    Comment: CRITICAL RESULT CALLED TO, READ BACK BY AND VERIFIED WITH: WALKER AT 2340 ON 10/29/2019 BY MOSLEY,J  Performed at [facility], [address], [place], [place] [address]   Lactic acid, plasma     Status: Abnormal   Collection Time: 10/30/19  1:37 AM  Result Value Ref Range   Lactic Acid, Venous 2.3 (HH) 0.5 - 1.9 mmol/L    Comment: CRITICAL RESULT CALLED TO, READ BACK BY AND VERIFIED WITH: Lockie Pares. AT 0243 ON 10/30/2019 BY EVA Performed at [facility], [address] [place], [place] [address]   Troponin I (High Sensitivity)     Status: None   Collection Time: 10/30/19  1:37 AM  Result Value Ref Range   Troponin I (High Sensitivity) 8 <18 ng/L    Comment: (NOTE) Elevated high sensitivity troponin I (hsTnI) values and significant  changes across serial measurements may suggest ACS but many other  chronic and acute conditions are known to elevate hsTnI results.  Refer to the "Links" section for chest pain algorithms and additional   guidance. Performed at [facility], [address], [place], [place] [address]     Chemistries  Recent Labs  Lab 10/25/19 1151 10/29/19 2306  NA 137 131*  K 3.6 3.5  CL 102 98  CO2 23 17*  GLUCOSE 114* 166*  BUN 14 15  CREATININE 0.84 1.18*  CALCIUM 8.9 8.7*  AST  --  33  ALT  --  18  ALKPHOS  --  52  BILITOT  --  1.2   ------------------------------------------------------------------------------------------------------------------  ------------------------------------------------------------------------------------------------------------------ GFR: Estimated Creatinine Clearance: 30 mL/min (A) (by C-G formula based on SCr of 1.18 mg/dL (H)). Liver Function Tests: Recent Labs  Lab 10/29/19 2306  AST 33  ALT 18  ALKPHOS 52  BILITOT 1.2  PROT 7.2  ALBUMIN 3.1*   No results for input(s): LIPASE, AMYLASE in the last 168 hours. No results for input(s): AMMONIA in the last 168 hours. Coagulation Profile: Recent Labs  Lab 10/25/19 1231  INR 1.0   Cardiac Enzymes: No results for input(s): CKTOTAL, CKMB, CKMBINDEX, TROPONINI in the last 168 hours. BNP (last 3 results) No  results for input(s): PROBNP in the last 8760 hours. HbA1C: No results for input(s): HGBA1C in the last 72 hours. CBG: No results for input(s): GLUCAP in the last 168 hours. Lipid Profile: No results for input(s): CHOL, HDL, LDLCALC, TRIG, CHOLHDL, LDLDIRECT in the last 72 hours. Thyroid Function Tests: No results for input(s): TSH, T4TOTAL, FREET4, T3FREE, THYROIDAB in the last 72 hours. Anemia Panel: No results for input(s): VITAMINB12, FOLATE, FERRITIN, TIBC, IRON, RETICCTPCT in the last 72 hours.  --------------------------------------------------------------------------------------------------------------- Urine analysis:    Component Value Date/Time   COLORURINE YELLOW 09/26/2016 1730   APPEARANCEUR CLEAR 09/26/2016 1730   LABSPEC <1.005 (L) 09/26/2016 1730   PHURINE 6.0  09/26/2016 1730   GLUCOSEU NEGATIVE 09/26/2016 1730   HGBUR MODERATE (A) 09/26/2016 1730   BILIRUBINUR NEGATIVE 09/26/2016 1730   KETONESUR NEGATIVE 09/26/2016 1730   PROTEINUR NEGATIVE 09/26/2016 1730   UROBILINOGEN 4.0 (H) 03/13/2014 1538   NITRITE NEGATIVE 09/26/2016 1730   LEUKOCYTESUR NEGATIVE 09/26/2016 1730      Imaging Results:    CT Head Wo Contrast  Result Date: 10/29/2019 CLINICAL DATA:  Confusion EXAM: CT HEAD WITHOUT CONTRAST TECHNIQUE: Contiguous axial images were obtained from the base of the skull through the vertex without intravenous contrast. COMPARISON:  09/26/2016 FINDINGS: Brain: Mild atrophic changes are noted commenced with the patient's given age. No findings to suggest acute hemorrhage or space-occupying mass lesion are noted. Scattered white matter ischemic changes are noted. Vascular: No hyperdense vessel or unexpected calcification. Skull: Normal. Negative for fracture or focal lesion. Sinuses/Orbits: No acute finding. Other: None. IMPRESSION: Mild atrophic and ischemic changes without acute abnormality. Electronically Signed   By: Inez Catalina M.D.   On: 10/29/2019 23:37   DG Chest Port 1 View  Result Date: 10/29/2019 CLINICAL DATA:  Confusion and shortness of breath EXAM: PORTABLE CHEST 1 VIEW COMPARISON:  12/01/17 FINDINGS: Cardiac shadow is stable. Aortic calcifications are again seen. The lungs are well aerated bilaterally. No focal infiltrate or sizable effusion is seen. Degenerative changes of the shoulder joints are noted bilaterally. IMPRESSION: No acute abnormality noted. Aortic Atherosclerosis (ICD10-I70.0). Electronically Signed   By: Inez Catalina M.D.   On: 10/29/2019 23:38    My personal review of EKG: Rhythm NSR, no ST changes   Assessment & Plan:    Active Problems:   AMS (altered mental status)   1. Altered mental status-resolved at this time, unclear etiology of these episodes of altered mental status.  CT head is negative.  Check  thiamine, B12, TSH.  UA is clear, chest x-ray shows no infiltrate.  WBC is normal.  Patient was taking Vicodin as well as tramadol at home for pain control since surgery.  She took tramadol yesterday.  Could be medication induced.  Will closely monitor.  2. Acute kidney injury-patient presented with creatinine of 1.18, baseline creatinine 0.84.  Start gentle IV hydration with normal saline.  Follow BMP in a.m.  3. Metabolic acidosis-lactic acid 3.4 on initial presentation, unclear etiology.  No infectious source identified.  Lactic acid improved to 2.3 with IV fluids.  Continue IV normal saline as above.  4. History of pulmonary embolism-continue Xarelto  5. Hypertension-continue metoprolol    DVT Prophylaxis-   Xarelto  AM Labs Ordered, also please review Full Orders  Family Communication: Admission, patients condition and plan of care including tests being ordered have been discussed with the patient who indicate understanding and agree with the plan and Code Status.  Code Status:  Full code  Admission  status: Observation: Based on patients clinical presentation and evaluation of above clinical data, I have made determination that patient will need less than 2 midnight stay in hospital  Time spent in minutes : 60 min   Ryiah Bellissimo S Derrian Poli M.D

## 2019-10-30 NOTE — ED Notes (Signed)
Date and time results received: 10/30/19 0242 (use smartphrase ".now" to insert current time)  Test: lactic Critical Value: 2.3  Name of Provider Notified: Sedonia Small, MD  Orders Received? Or Actions Taken?: acknowledged

## 2019-10-30 NOTE — ED Notes (Signed)
EKG was delayed due to patient being in the hallway. Portable EKG machine was broken.

## 2019-10-30 NOTE — ED Notes (Signed)
Call to floor for report RN states she does not believe she is to take pt  Will have receiving RN call for report

## 2019-10-30 NOTE — ED Notes (Signed)
Call for report  Second nurse states she is not taking the pt   She states Quillian Quince, RN is to receive report who is called and takes report

## 2019-10-30 NOTE — ED Notes (Addendum)
Patient refused COVID test.

## 2019-10-30 NOTE — ED Notes (Signed)
Patient complaining of pain above her left eyebrow. Patient states sharp pain.

## 2019-10-30 NOTE — ED Notes (Signed)
covid spec to lab 

## 2019-10-30 NOTE — ED Notes (Signed)
Pt has been alert, oriented today thus far   Currently eating lunch stating she has had no appetite recently However, is eating lunch voraciously

## 2019-10-31 LAB — CBC WITH DIFFERENTIAL/PLATELET
Abs Immature Granulocytes: 0.01 10*3/uL (ref 0.00–0.07)
Basophils Absolute: 0 10*3/uL (ref 0.0–0.1)
Basophils Relative: 0 %
Eosinophils Absolute: 0 10*3/uL (ref 0.0–0.5)
Eosinophils Relative: 0 %
HCT: 37.8 % (ref 36.0–46.0)
Hemoglobin: 11.7 g/dL — ABNORMAL LOW (ref 12.0–15.0)
Immature Granulocytes: 0 %
Lymphocytes Relative: 43 %
Lymphs Abs: 1.7 10*3/uL (ref 0.7–4.0)
MCH: 30 pg (ref 26.0–34.0)
MCHC: 31 g/dL (ref 30.0–36.0)
MCV: 96.9 fL (ref 80.0–100.0)
Monocytes Absolute: 0.7 10*3/uL (ref 0.1–1.0)
Monocytes Relative: 17 %
Neutro Abs: 1.6 10*3/uL — ABNORMAL LOW (ref 1.7–7.7)
Neutrophils Relative %: 40 %
Platelets: 173 10*3/uL (ref 150–400)
RBC: 3.9 MIL/uL (ref 3.87–5.11)
RDW: 12 % (ref 11.5–15.5)
WBC: 4.1 10*3/uL (ref 4.0–10.5)
nRBC: 0 % (ref 0.0–0.2)

## 2019-10-31 MED ORDER — CYANOCOBALAMIN 1000 MCG/ML IJ SOLN
1000.0000 ug | Freq: Once | INTRAMUSCULAR | Status: AC
  Administered 2019-10-31: 1000 ug via INTRAMUSCULAR
  Filled 2019-10-31: qty 1

## 2019-10-31 MED ORDER — SODIUM CHLORIDE 0.9 % IV SOLN
1.0000 g | INTRAVENOUS | Status: DC
  Administered 2019-10-31 – 2019-11-03 (×4): 1 g via INTRAVENOUS
  Filled 2019-10-31 (×3): qty 10

## 2019-10-31 NOTE — TOC Initial Note (Signed)
Transition of Care [facility]) - Initial/Assessment Note    Patient Details  Name: [name] Long MRN: [id number] Date of Birth: Nov 02, 1941  [facility]) CM/SW Contact:    Leitha Bleak, RN Phone Number: 10/31/2019, 12:49 PM  Clinical Narrative:       Patient admitted for AMS, UTI, status post knee surgery 2 weeks ago. PCP had referred out to [facility].  Tim with [facility] called today, they was unable to admitted patient she was bed bound and not eating, confused and spoiled the bed.  They sent patient to ED.  Pt was dx with UTI.  Today alert and oriented. Realized the need for Rehab. Gave permission to send referrals out, prefers to stay in [place] if possible.             Expected Discharge Plan: Skilled Nursing Facility Barriers to Discharge: Continued Medical Work up   Patient Goals and CMS Choice Patient states their goals for this hospitalization and ongoing recovery are:: to go to rehab then back home. CMS Medicare.gov Compare Post Acute Care list provided to:: Patient Choice offered to / list presented to : Patient  Expected Discharge Plan and Services Expected Discharge Plan: Skilled Nursing Facility      Prior Living Arrangements/Services   Lives with:: Self          Need for Family Participation in Patient Care: Yes (Comment) Care giver support system in place?: Yes (comment) Current home services: DME Criminal Activity/Legal Involvement Pertinent to Current Situation/Hospitalization: No - Comment as needed  Activities of Daily Living Home Assistive Devices/Equipment: Walker (specify type), Splint (specify type) ADL Screening (condition at time of admission) Patient's cognitive ability adequate to safely complete daily activities?: No Is the patient deaf or have difficulty hearing?: No Does the patient have difficulty seeing, even when wearing glasses/contacts?: No Does the patient have difficulty concentrating, remembering, or making decisions?:  No Patient able to express need for assistance with ADLs?: Yes Does the patient have difficulty dressing or bathing?: Yes Independently performs ADLs?: No Communication: Independent Dressing (OT): Needs assistance Is this a change from baseline?: Pre-admission baseline Grooming: Needs assistance Is this a change from baseline?: Pre-admission baseline Feeding: Needs assistance Is this a change from baseline?: Pre-admission baseline Bathing: Needs assistance Is this a change from baseline?: Pre-admission baseline Toileting: Needs assistance Is this a change from baseline?: Pre-admission baseline In/Out Bed: Needs assistance Is this a change from baseline?: Pre-admission baseline Walks in Home: Needs assistance Is this a change from baseline?: Pre-admission baseline Does the patient have difficulty walking or climbing stairs?: Yes Weakness of Legs: Both Weakness of Arms/Hands: Both  Permission Sought/Granted   Permission granted to share information with : Yes, Verbal Permission Granted           Permission granted to share info w Contact Information: SNF  Emotional Assessment       Orientation: : Oriented to Self, Oriented to Place, Oriented to  Time, Oriented to Situation Alcohol / Substance Use: Other (comment) Psych Involvement: No (comment)  Admission diagnosis:  Transient alteration of awareness [R40.4] SOB (shortness of breath) [R06.02] AMS (altered mental status) [R41.82] UTI (urinary tract infection) [N39.0] Patient Active Problem List   Diagnosis Date Noted  . AMS (altered mental status) 10/30/2019  . UTI (urinary tract infection) 10/30/2019  . Displaced transverse fracture of left patella, initial encounter for closed fracture 10/25/2019  . Left patella fracture 10/25/2019  . Screening for colon cancer 10/02/2011   PCP:  Juanetta Gosling,  Percell Miller, MD Pharmacy:   [facility], [address] [place] [address] Phone:  [phone] Fax: [phone]   Readmission Risk Interventions No flowsheet data found.

## 2019-10-31 NOTE — Progress Notes (Signed)
Subjective: She was admitted with intermittent episodes of altered mental status.  It is not totally clear what the cause is but she may have a urinary tract infection and urine culture is pending.  Lab work also shows vitamin B12 level is low.  She says she feels better.  She is not having any other episodes of confusion since admission  Objective: Vital signs in last 24 hours: Temp:  [97.5 F (36.4 C)-98.4 F (36.9 C)] 97.5 F (36.4 C) (12/14 0612) Pulse Rate:  [63-97] 66 (12/14 0612) Resp:  [18-41] 18 (12/14 0612) BP: (96-128)/(56-74) 122/73 (12/14 0612) SpO2:  [79 %-100 %] 100 % (12/14 0612) Weight:  [50.3 kg] 50.3 kg (12/13 1552) Weight change:  Last BM Date: 10/29/19  Intake/Output from previous day: 12/13 0701 - 12/14 0700 In: 1.9 [I.V.:1.9] Out: 450 [Urine:450]  PHYSICAL EXAM General appearance: alert, cooperative and no distress Resp: clear to auscultation bilaterally Cardio: regular rate and rhythm, S1, S2 normal, no murmur, click, rub or gallop GI: soft, non-tender; bowel sounds normal; no masses,  no organomegaly Extremities: extremities normal, atraumatic, no cyanosis or edema  Lab Results:  Results for orders placed or performed during the hospital encounter of 10/29/19 (from the past 48 hour(s))  CBC with Differential     Status: None   Collection Time: 10/29/19 11:06 PM  Result Value Ref Range   WBC 6.8 4.0 - 10.5 K/uL   RBC 4.24 3.87 - 5.11 MIL/uL   Hemoglobin 12.7 12.0 - 15.0 g/dL   HCT 16.1 09.6 - 04.5 %   MCV 94.1 80.0 - 100.0 fL   MCH 30.0 26.0 - 34.0 pg   MCHC 31.8 30.0 - 36.0 g/dL   RDW 40.9 81.1 - 91.4 %   Platelets 236 150 - 400 K/uL   nRBC 0.0 0.0 - 0.2 %   Neutrophils Relative % 59 %   Neutro Abs 4.1 1.7 - 7.7 K/uL   Lymphocytes Relative 28 %   Lymphs Abs 1.9 0.7 - 4.0 K/uL   Monocytes Relative 12 %   Monocytes Absolute 0.8 0.1 - 1.0 K/uL   Eosinophils Relative 0 %   Eosinophils Absolute 0.0 0.0 - 0.5 K/uL   Basophils Relative 0 %    Basophils Absolute 0.0 0.0 - 0.1 K/uL   Immature Granulocytes 1 %   Abs Immature Granulocytes 0.04 0.00 - 0.07 K/uL    Comment: Performed at [facility], [address], [place], [place] [address]  Comprehensive metabolic panel     Status: Abnormal   Collection Time: 10/29/19 11:06 PM  Result Value Ref Range   Sodium 131 (L) 135 - 145 mmol/L   Potassium 3.5 3.5 - 5.1 mmol/L   Chloride 98 98 - 111 mmol/L   CO2 17 (L) 22 - 32 mmol/L   Glucose, Bld 166 (H) 70 - 99 mg/dL   BUN 15 8 - 23 mg/dL   Creatinine, Ser 6.21 (H) 0.44 - 1.00 mg/dL   Calcium 8.7 (L) 8.9 - 10.3 mg/dL   Total Protein 7.2 6.5 - 8.1 g/dL   Albumin 3.1 (L) 3.5 - 5.0 g/dL   AST 33 15 - 41 U/L   ALT 18 0 - 44 U/L   Alkaline Phosphatase 52 38 - 126 U/L   Total Bilirubin 1.2 0.3 - 1.2 mg/dL   GFR calc non Af Amer 44 (L) >60 mL/min   GFR calc Af Amer 50 (L) >60 mL/min   Anion gap 16 (H) 5 - 15  Comment: Performed at [facility], [address], [place], [place] [address]  Lactic acid, plasma     Status: Abnormal   Collection Time: 10/29/19 11:06 PM  Result Value Ref Range   Lactic Acid, Venous 3.4 (HH) 0.5 - 1.9 mmol/L    Comment: CRITICAL RESULT CALLED TO, READ BACK BY AND VERIFIED WITH: WALKER AT 2340 ON 10/29/2019 BY MOSLEY,J  Performed at [facility], [address] [place], [place] [address]   Lactic acid, plasma     Status: Abnormal   Collection Time: 10/30/19  1:37 AM  Result Value Ref Range   Lactic Acid, Venous 2.3 (HH) 0.5 - 1.9 mmol/L    Comment: CRITICAL RESULT CALLED TO, READ BACK BY AND VERIFIED WITH: Lockie Pares. AT 0243 ON 10/30/2019 BY EVA Performed at [facility], [address], [place], [place] [address]   Blood culture (routine x 2)     Status: None (Preliminary result)   Collection Time: 10/30/19  1:37 AM   Specimen: Right Antecubital; Blood  Result Value Ref Range   Specimen Description RIGHT ANTECUBITAL    Special Requests NONE    Culture      NO GROWTH 1 DAY Performed at [facility], [address] [place], [place] [address]    Report Status PENDING   Troponin I (High Sensitivity)     Status: None   Collection Time: 10/30/19  1:37 AM  Result Value Ref Range   Troponin I (High Sensitivity) 8 <18 ng/L    Comment: (NOTE) Elevated high sensitivity troponin I (hsTnI) values and significant  changes across serial measurements may suggest ACS but many other  chronic and acute conditions are known to elevate hsTnI results.  Refer to the "Links" section for chest pain algorithms and additional  guidance. Performed at [facility], [address] [place], [place] [address]   Blood culture (routine x 2)     Status: None (Preliminary result)   Collection Time: 10/30/19  1:56 AM   Specimen: BLOOD RIGHT HAND  Result Value Ref Range   Specimen Description BLOOD RIGHT HAND    Special Requests NONE    Culture      NO GROWTH 1 DAY Performed at [facility], [address], [place], [place] [address]    Report Status PENDING   Troponin I (High Sensitivity)     Status: None   Collection Time: 10/30/19  4:14 AM  Result Value Ref Range   Troponin I (High Sensitivity) 9 <18 ng/L    Comment: (NOTE) Elevated high sensitivity troponin I (hsTnI) values and significant  changes across serial measurements may suggest ACS but many other  chronic and acute conditions are known to elevate hsTnI results.  Refer to the "Links" section for chest pain algorithms and additional  guidance. Performed at [facility], [address], [place], [place] [address]   TSH     Status: None   Collection Time: 10/30/19  4:14 AM  Result Value Ref Range   TSH 0.520 0.350 - 4.500 uIU/mL    Comment: Performed by a 3rd Generation assay with a functional sensitivity of <=0.01 uIU/mL. Performed at [facility], [address], [place], [place] [address]   Vitamin B12     Status: Abnormal   Collection Time: 10/30/19  4:14 AM  Result Value Ref Range   Vitamin B-12 122 (L) 180 - 914 pg/mL    Comment:  (NOTE) This assay is not validated for testing neonatal or myeloproliferative syndrome specimens for Vitamin  B12 levels. Performed at [facility], [address], [place], [place] [address]   CBC     Status: Abnormal   Collection Time: 10/30/19  8:11 AM  Result Value Ref Range   WBC 8.1 4.0 - 10.5 K/uL   RBC 3.76 (L) 3.87 - 5.11 MIL/uL   Hemoglobin 11.2 (L) 12.0 - 15.0 g/dL   HCT 35.9 (L) 36.0 - 46.0 %   MCV 95.5 80.0 - 100.0 fL   MCH 29.8 26.0 - 34.0 pg   MCHC 31.2 30.0 - 36.0 g/dL   RDW 12.0 11.5 - 15.5 %   Platelets 188 150 - 400 K/uL   nRBC 0.0 0.0 - 0.2 %    Comment: Performed at [facility], [address] [place], [place] [address]  Comprehensive metabolic panel     Status: Abnormal   Collection Time: 10/30/19  8:11 AM  Result Value Ref Range   Sodium 134 (L) 135 - 145 mmol/L   Potassium 4.5 3.5 - 5.1 mmol/L    Comment: DELTA CHECK NOTED   Chloride 98 98 - 111 mmol/L   CO2 24 22 - 32 mmol/L   Glucose, Bld 101 (H) 70 - 99 mg/dL   BUN 17 8 - 23 mg/dL   Creatinine, Ser 1.03 (H) 0.44 - 1.00 mg/dL   Calcium 8.1 (L) 8.9 - 10.3 mg/dL   Total Protein 6.5 6.5 - 8.1 g/dL   Albumin 2.9 (L) 3.5 - 5.0 g/dL   AST 28 15 - 41 U/L   ALT 15 0 - 44 U/L   Alkaline Phosphatase 46 38 - 126 U/L   Total Bilirubin 1.0 0.3 - 1.2 mg/dL   GFR calc non Af Amer 51 (L) >60 mL/min   GFR calc Af Amer 59 (L) >60 mL/min   Anion gap 12 5 - 15    Comment: Performed at [facility], [address] [place], [place] [address]  Urinalysis, Routine w reflex microscopic     Status: Abnormal   Collection Time: 10/30/19 10:28 AM  Result Value Ref Range   Color, Urine AMBER (A) YELLOW    Comment: BIOCHEMICALS MAY BE AFFECTED BY COLOR   APPearance CLOUDY (A) CLEAR   Specific Gravity, Urine 1.016 1.005 - 1.030   pH 5.0 5.0 - 8.0   Glucose, UA NEGATIVE NEGATIVE mg/dL   Hgb urine dipstick LARGE (A) NEGATIVE   Bilirubin Urine NEGATIVE NEGATIVE   Ketones, ur 20 (A) NEGATIVE mg/dL   Protein, ur 30 (A)  NEGATIVE mg/dL   Nitrite NEGATIVE NEGATIVE   Leukocytes,Ua MODERATE (A) NEGATIVE   RBC / HPF 21-50 0 - 5 RBC/hpf   WBC, UA >50 (H) 0 - 5 WBC/hpf   Bacteria, UA RARE (A) NONE SEEN   Squamous Epithelial / LPF 0-5 0 - 5   WBC Clumps PRESENT    Mucus PRESENT     Comment: Performed at [facility], [address], [place], [place] [address]  SARS CORONAVIRUS 2 (TAT 6-24 HRS) Nasopharyngeal Nasopharyngeal Swab     Status: None   Collection Time: 10/30/19 12:40 PM   Specimen: Nasopharyngeal Swab  Result Value Ref Range   SARS Coronavirus 2 NEGATIVE NEGATIVE    Comment: (NOTE) SARS-CoV-2 target nucleic acids are NOT DETECTED. The SARS-CoV-2 RNA is generally detectable in upper and lower respiratory specimens during the acute phase of infection. Negative results do not preclude SARS-CoV-2 infection, do not rule out co-infections with other pathogens, and should not be used as the sole basis for  treatment or other patient management decisions. Negative results must be combined with clinical observations, patient history, and epidemiological information. The expected result is Negative. Fact Sheet for Patients: HairSlick.nohttps://www.fda.gov/media/138098/download Fact Sheet for Healthcare Providers: quierodirigir.comhttps://www.fda.gov/media/138095/download This test is not yet approved or cleared by the [place] FDA and  has been authorized for detection and/or diagnosis of SARS-CoV-2 by FDA under an Emergency Use Authorization (EUA). This EUA will remain  in effect (meaning this test can be used) for the duration of the COVID-19 declaration under Section 56 4(b)(1) of the Act, 21 U.S.C. section 360bbb-3(b)(1), unless the authorization is terminated or revoked sooner. Performed at [facility], [address] [place], [place] [address]   CBC with Differential     Status: Abnormal   Collection Time: 10/31/19  5:56 AM  Result Value Ref Range   WBC 4.1 4.0 - 10.5 K/uL   RBC 3.90 3.87 - 5.11 MIL/uL    Hemoglobin 11.7 (L) 12.0 - 15.0 g/dL   HCT 30.837.8 65.736.0 - 84.646.0 %   MCV 96.9 80.0 - 100.0 fL   MCH 30.0 26.0 - 34.0 pg   MCHC 31.0 30.0 - 36.0 g/dL   RDW 96.212.0 95.211.5 - 84.115.5 %   Platelets 173 150 - 400 K/uL   nRBC 0.0 0.0 - 0.2 %   Neutrophils Relative % 40 %   Neutro Abs 1.6 (L) 1.7 - 7.7 K/uL   Lymphocytes Relative 43 %   Lymphs Abs 1.7 0.7 - 4.0 K/uL   Monocytes Relative 17 %   Monocytes Absolute 0.7 0.1 - 1.0 K/uL   Eosinophils Relative 0 %   Eosinophils Absolute 0.0 0.0 - 0.5 K/uL   Basophils Relative 0 %   Basophils Absolute 0.0 0.0 - 0.1 K/uL   Immature Granulocytes 0 %   Abs Immature Granulocytes 0.01 0.00 - 0.07 K/uL    Comment: Performed at [facility], [address] [place], [place] [address]    ABGS No results for input(s): PHART, PO2ART, TCO2, HCO3 in the last 72 hours.  Invalid input(s): PCO2 CULTURES Recent Results (from the past 240 hour(s))  SARS CORONAVIRUS 2 (TAT 6-24 HRS)     Status: None   Collection Time: 10/21/19  3:44 PM  Result Value Ref Range Status   SARS Coronavirus 2 NEGATIVE NEGATIVE Final    Comment: (NOTE) SARS-CoV-2 target nucleic acids are NOT DETECTED. The SARS-CoV-2 RNA is generally detectable in upper and lower respiratory specimens during the acute phase of infection. Negative results do not preclude SARS-CoV-2 infection, do not rule out co-infections with other pathogens, and should not be used as the sole basis for treatment or other patient management decisions. Negative results must be combined with clinical observations, patient history, and epidemiological information. The expected result is Negative. Fact Sheet for Patients: HairSlick.nohttps://www.fda.gov/media/138098/download Fact Sheet for Healthcare Providers: quierodirigir.comhttps://www.fda.gov/media/138095/download This test is not yet approved or cleared by the [place] FDA and  has been authorized for detection and/or diagnosis of SARS-CoV-2 by FDA under an Emergency Use Authorization (EUA).  This EUA will remain  in effect (meaning this test can be used) for the duration of the COVID-19 declaration under Section 56 4(b)(1) of the Act, 21 U.S.C. section 360bbb-3(b)(1), unless the authorization is terminated or revoked sooner. Performed at [facility], [address] [place], [place] [address]   Blood culture (routine x 2)     Status: None (Preliminary result)   Collection Time: 10/30/19  1:37 AM   Specimen: Right Antecubital;  Blood  Result Value Ref Range Status   Specimen Description RIGHT ANTECUBITAL  Final   Special Requests NONE  Final   Culture   Final    NO GROWTH 1 DAY Performed at [facility], [address], [place], [place] [address]    Report Status PENDING  Incomplete  Blood culture (routine x 2)     Status: None (Preliminary result)   Collection Time: 10/30/19  1:56 AM   Specimen: BLOOD RIGHT HAND  Result Value Ref Range Status   Specimen Description BLOOD RIGHT HAND  Final   Special Requests NONE  Final   Culture   Final    NO GROWTH 1 DAY Performed at [facility], [address] [place], [place] [address]    Report Status PENDING  Incomplete  SARS CORONAVIRUS 2 (TAT 6-24 HRS) Nasopharyngeal Nasopharyngeal Swab     Status: None   Collection Time: 10/30/19 12:40 PM   Specimen: Nasopharyngeal Swab  Result Value Ref Range Status   SARS Coronavirus 2 NEGATIVE NEGATIVE Final    Comment: (NOTE) SARS-CoV-2 target nucleic acids are NOT DETECTED. The SARS-CoV-2 RNA is generally detectable in upper and lower respiratory specimens during the acute phase of infection. Negative results do not preclude SARS-CoV-2 infection, do not rule out co-infections with other pathogens, and should not be used as the sole basis for treatment or other patient management decisions. Negative results must be combined with clinical observations, patient history, and epidemiological information. The expected result is Negative. Fact Sheet for  Patients: HairSlick.no Fact Sheet for Healthcare Providers: quierodirigir.com This test is not yet approved or cleared by the [place] FDA and  has been authorized for detection and/or diagnosis of SARS-CoV-2 by FDA under an Emergency Use Authorization (EUA). This EUA will remain  in effect (meaning this test can be used) for the duration of the COVID-19 declaration under Section 56 4(b)(1) of the Act, 21 U.S.C. section 360bbb-3(b)(1), unless the authorization is terminated or revoked sooner. Performed at [facility], [address], [place], [place] [address]    Studies/Results: CT Head Wo Contrast  Result Date: 10/29/2019 CLINICAL DATA:  Confusion EXAM: CT HEAD WITHOUT CONTRAST TECHNIQUE: Contiguous axial images were obtained from the base of the skull through the vertex without intravenous contrast. COMPARISON:  09/26/2016 FINDINGS: Brain: Mild atrophic changes are noted commenced with the patient's given age. No findings to suggest acute hemorrhage or space-occupying mass lesion are noted. Scattered white matter ischemic changes are noted. Vascular: No hyperdense vessel or unexpected calcification. Skull: Normal. Negative for fracture or focal lesion. Sinuses/Orbits: No acute finding. Other: None. IMPRESSION: Mild atrophic and ischemic changes without acute abnormality. Electronically Signed   By: Alcide Clever M.D.   On: 10/29/2019 23:37   DG Chest Port 1 View  Result Date: 10/29/2019 CLINICAL DATA:  Confusion and shortness of breath EXAM: PORTABLE CHEST 1 VIEW COMPARISON:  12/01/17 FINDINGS: Cardiac shadow is stable. Aortic calcifications are again seen. The lungs are well aerated bilaterally. No focal infiltrate or sizable effusion is seen. Degenerative changes of the shoulder joints are noted bilaterally. IMPRESSION: No acute abnormality noted. Aortic Atherosclerosis (ICD10-I70.0). Electronically Signed   By: Alcide Clever  M.D.   On: 10/29/2019 23:38    Medications:  Prior to Admission:  Medications Prior to Admission  Medication Sig Dispense Refill Last Dose  . metoprolol tartrate (LOPRESSOR) 25 MG tablet Take 12.5 mg by mouth 2 (two) times daily.    Past Week at Unknown time  .  ondansetron (ZOFRAN ODT) 4 MG disintegrating tablet Take 1 tablet (4 mg total) by mouth every 8 (eight) hours as needed. 20 tablet 0   . traMADol (ULTRAM) 50 MG tablet Take 1 tablet (50 mg total) by mouth every 6 (six) hours as needed. (Patient taking differently: Take 50 mg by mouth every 6 (six) hours as needed for moderate pain. ) 15 tablet 0 Past Week at Unknown time  . HYDROcodone-acetaminophen (NORCO) 5-325 MG tablet Take 1 tablet by mouth every 4 (four) hours as needed for moderate pain. 40 tablet 0   . XARELTO 20 MG TABS tablet Take 20 mg by mouth daily.      Scheduled: . metoprolol tartrate  12.5 mg Oral BID  . rivaroxaban  20 mg Oral q1800   Continuous: . sodium chloride 75 mL/hr at 10/31/19 0049  . cefTRIAXone (ROCEPHIN)  IV     ZOX:[id number], ondansetron **OR** ondansetron (ZOFRAN) IV  Assesment: She has altered mental status.  She probably has UTI and I have started her on Rocephin.  We are awaiting culture.  She has low vitamin B12 level and that will be treated  She has history of pulmonary embolism and she is on Xarelto  She had recent fracture of her knee kneecap that required surgery Active Problems:   AMS (altered mental status)   UTI (urinary tract infection)    Plan: Continue treatments.  Add vitamin B12    LOS: 1 day   Fredirick Maudlin 10/31/2019, 8:13 AM

## 2019-10-31 NOTE — Plan of Care (Signed)
  Problem: Education: Goal: Knowledge of General Education information will improve Description Including pain rating scale, medication(s)/side effects and non-pharmacologic comfort measures Outcome: Progressing   

## 2019-10-31 NOTE — Progress Notes (Signed)
Initial Nutrition Assessment  DOCUMENTATION CODES:      INTERVENTION:  Recommend liberalize diet to Regular   Ensure Enlive po BID, each supplement provides 350 kcal and 20 grams of protein    NUTRITION DIAGNOSIS:   Inadequate oral intake related to acute illness(pt associates decrease in appetite wtih recent acute knee fracture. Altered mental status /UTI also likely contributing to loss of appetite) as evidenced by per patient/family report.   GOAL:   Patient will meet greater than or equal to 90% of their needs MONITOR:   PO intake, Skin, Supplement acceptance, Labs, Weight trends, I & O's  REASON FOR ASSESSMENT:   Malnutrition Screening Tool    ASSESSMENT: Patient is an 78 yo female with recent hx of patella fracture and is s/p ORIF at [facility]. Patient found to be unable to care for herself at home and is being recommended for placement by Home Health service.   Presents with altered mental status and  Lactic acid 2.3 (H) yesterday. Her son lives near and is involved in her care.   Patient ate well for lunch yesterday per nursing. Able to feed herself. Patient reported today that her appetite had not been good since she fractured her knee.    Weights are stable 50 kg the past 6 weeks. Previously April of 2019 she weighed 54.4 kg.   Labs reviewed: Vitamin B-12 -122 (L) BMP Latest Ref Rng & Units 10/30/2019 10/29/2019 10/25/2019  Glucose 70 - 99 mg/dL 101(H) 166(H) 114(H)  BUN 8 - 23 mg/dL 17 15 14   Creatinine 0.44 - 1.00 mg/dL 1.03(H) 1.18(H) 0.84  Sodium 135 - 145 mmol/L 134(L) 131(L) 137  Potassium 3.5 - 5.1 mmol/L 4.5 3.5 3.6  Chloride 98 - 111 mmol/L 98 98 102  CO2 22 - 32 mmol/L 24 17(L) 23  Calcium 8.9 - 10.3 mg/dL 8.1(L) 8.7(L) 8.9     NUTRITION - FOCUSED PHYSICAL EXAM:   Diet Order:   Diet Order            Diet Heart Room service appropriate? Yes; Fluid consistency: Thin  Diet effective now              EDUCATION NEEDS:  Education needs have been  addressed    Skin:  Skin Assessment: Reviewed RN Assessment Skin Integrity Issues:: Incisions Incisions: surgical incision right knee dry and cracking  Last BM:  12/13  Height:   Ht Readings from Last 1 Encounters:  10/30/19 5\' 2"  (1.575 m)    Weight:   Wt Readings from Last 1 Encounters:  10/30/19 50.3 kg    Ideal Body Weight:  50 kg  BMI:  Body mass index is 20.28 kg/m.  Estimated Nutritional Needs:   Kcal:  2542-7062  Protein:  65-70 gr protein  Fluid:  1.4-1.6 liters daily   Colman Cater MS,RD,CSG,LDN Office: [phone] Pager: [phone]

## 2019-10-31 NOTE — Progress Notes (Signed)
  Nursing Home Choices Given to Patient :   [facility] [address] [place], [place] [address] [phone] Overall rating Much above average 2. 0.4 mi [facility] [address] [place], [place] [address] [phone] Overall rating Below average 3. 10.4 mi [place], [place] [address] [phone] Overall rating Above average 4. 12.4 mi [place], [place] [address] [phone] Overall rating Average 5. 13.2 mi [facility] [place], [place] [address] [phone] Overall rating Much below average 6. 18.1 mi [place] [address] [place], [place] [address] [phone] Overall rating Average 7. 18.2 mi [place] [address] [place], [place] [address] [phone] Overall rating Above average 8. 18.6 mi [facility] [place], [place] [address] [phone] Overall rating Much below average 9. 18.6 mi [facility] [place], [place] [address] [phone] Overall rating Much below average 10. 19.4 mi [place] [facility] [place], [place] [address] [phone] Overall rating Below average 11. 19.6 mi [place] [facility] [place], [place] [address] [phone] Overall rating Much below average

## 2019-10-31 NOTE — NC FL2 (Signed)
  [place] LEVEL OF CARE SCREENING TOOL     IDENTIFICATION  Patient Name: [name] Long Birthdate: 10/30/1941 Sex: female Admission Date (Current Location): 10/29/2019  [facility] and [place] Number:  [employer] and Address:  [facility] [address] [place]      Provider Number: [id number]  Attending Physician Name and Address:  Sinda Du, MD  Relative Name and Phone Number:  Vickey Sages  [phone]    Current Level of Care: Hospital Recommended Level of Care: [place] Prior Approval Number:    Date Approved/Denied:   PASRR Number: [id number]  Discharge Plan: SNF    Current Diagnoses: Patient Active Problem List   Diagnosis Date Noted  . AMS (altered mental status) 10/30/2019  . UTI (urinary tract infection) 10/30/2019  . Displaced transverse fracture of left patella, initial encounter for closed fracture 10/25/2019  . Left patella fracture 10/25/2019  . Screening for colon cancer 10/02/2011    Orientation RESPIRATION BLADDER Height & Weight     Self, Time, Situation, Place  Normal External catheter Weight: 50.3 kg Height:  5\' 2"  (157.5 cm)  BEHAVIORAL SYMPTOMS/MOOD NEUROLOGICAL BOWEL NUTRITION STATUS      Continent Diet(see discharge instruction)  AMBULATORY STATUS COMMUNICATION OF NEEDS Skin   Extensive Assist Verbally Normal                       Personal Care Assistance Level of Assistance  Bathing, Dressing, Feeding Bathing Assistance: Limited assistance Feeding assistance: Independent Dressing Assistance: Limited assistance     Functional Limitations Info  Sight, Speech, Hearing Sight Info: Adequate Hearing Info: Adequate Speech Info: Adequate    SPECIAL CARE FACTORS FREQUENCY  PT (By licensed PT)     PT Frequency: 5 times a week              Contractures Contractures Info: Not present    Additional Factors Info  Code Status, Allergies Code Status  Info: Full Allergies Info: NKDA           Current Medications (10/31/2019):  This is the current hospital active medication list Current Facility-Administered Medications  Medication Dose Route Frequency Provider Last Rate Last Admin  . 0.9 %  sodium chloride infusion   Intravenous Continuous Oswald Hillock, MD 75 mL/hr at 10/31/19 0049 New Bag at 10/31/19 0049  . cefTRIAXone (ROCEPHIN) 1 g in sodium chloride 0.9 % 100 mL IVPB  1 g Intravenous Q24H Sinda Du, MD 200 mL/hr at 10/31/19 0908 1 g at 10/31/19 0908  . HYDROcodone-acetaminophen (NORCO/VICODIN) 5-325 MG per tablet 1 tablet  1 tablet Oral Q4H PRN Enzo Bi, MD   1 tablet at 10/30/19 1822  . metoprolol tartrate (LOPRESSOR) tablet 12.5 mg  12.5 mg Oral BID Oswald Hillock, MD   12.5 mg at 10/31/19 1761  . ondansetron (ZOFRAN) tablet 4 mg  4 mg Oral Q6H PRN Oswald Hillock, MD       Or  . ondansetron (ZOFRAN) injection 4 mg  4 mg Intravenous Q6H PRN Oswald Hillock, MD      . rivaroxaban (XARELTO) tablet 20 mg  20 mg Oral q1800 Oswald Hillock, MD   20 mg at 10/30/19 1740     Discharge Medications: Please see discharge summary for a list of discharge medications.  Relevant Imaging Results:  Relevant Lab Results:   Additional Information SS#  [id number]  Boneta Lucks, RN

## 2019-11-01 LAB — POTASSIUM: Potassium: 3.5 mmol/L (ref 3.5–5.1)

## 2019-11-01 LAB — MAGNESIUM: Magnesium: 1.4 mg/dL — ABNORMAL LOW (ref 1.7–2.4)

## 2019-11-01 MED ORDER — MAGNESIUM SULFATE 4 GM/100ML IV SOLN
4.0000 g | Freq: Once | INTRAVENOUS | Status: AC
  Administered 2019-11-01: 4 g via INTRAVENOUS
  Filled 2019-11-01: qty 100

## 2019-11-01 NOTE — TOC Progression Note (Signed)
Transition of Care [facility]) - Progression Note    Patient Details  Name: [name] Long MRN: [id number] Date of Birth: 05-14-1941  Transition of Care [facility]) CM/SW Contact  Boneta Lucks, RN Phone Number: 11/01/2019, 2:56 PM  Clinical Narrative:   Patient wanted bed offer at [facility].  Debbie called later to say they are having roof leaks.  Spoke with patient she will accept the bed offer at [facility]. Ask CM to call her son to confirm he is okay with this plan.  Spoke to Mr Zenia Resides - son, he agrees with the plan and will call the facility and bring belonging to [facility] this evening. Plan for discharge tomorrow to [facility] [place] authorization started. TOC to follow.     Expected Discharge Plan: Skilled Nursing Facility Barriers to Discharge: Continued Medical Work up  Expected Discharge Plan and Services Expected Discharge Plan: [place]     Readmission Risk Interventions No flowsheet data found.

## 2019-11-01 NOTE — Evaluation (Signed)
Physical Therapy Evaluation Patient Details Name: [name] Long MRN: [id number] DOB: Oct 16, 1941 Today's Date: 11/01/2019   History of Present Illness  [name] Long  is a 78 y.o. female, with history of biliary pancreatitis, hypertension, solitary left kidney, pulmonary embolism, peripheral vascular disease who had left knee surgery, ORIF left patella for displaced transverse fracture of patella, 2 weeks ago, initially came to ED in the evening when home health aide said patient had.  Of confusion felt that she was unsafe to be at home alone.  She was transported to ED by EMS.  By the time patient came to ED she was back to her baseline.  Patient was discharged home.  When patient went to her son's house for approximately 10 minutes she became confused and incontinent.  As per son patient became confused and did not recognize him or know what was going on.  She was brought back to the hospital.    Clinical Impression  Patient able to complete all mobility with supervision today but remains limited secondary to impaired activity tolerance, fatigue, and balance s/p knee surgery. She completes mobility as listed below. She is unaware of any precautions/restrictions following her knee surgery and states she has not been following any. She educated on wearing her brace and limiting weight bearing on LLE to 50% as stated in patient chart. She is limited by fatigue with ambulation and returned to bed. Patient will benefit from continued physical therapy in hospital and recommended venue below to increase strength, balance, endurance for safe ADLs and gait.     Follow Up Recommendations SNF;Supervision/Assistance - 24 hour;Supervision for mobility/OOB    Equipment Recommendations  None recommended by PT    Recommendations for Other Services       Precautions / Restrictions Precautions Precautions: Fall Required Braces or Orthoses: Other Brace(hinge knee brace) Restrictions Weight Bearing  Restrictions: Yes LLE Weight Bearing: Partial weight bearing LLE Partial Weight Bearing Percentage or Pounds: 50 Other Position/Activity Restrictions: Knee extension at all times      Mobility  Bed Mobility Overal bed mobility: Modified Independent Bed Mobility: Supine to Sit;Sit to Supine     Supine to sit: Modified independent (Device/Increase time);Supervision Sit to supine: Modified independent (Device/Increase time);Supervision   General bed mobility comments: increased time, able to move LLE independently  Transfers Overall transfer level: Needs assistance Equipment used: Rolling walker (2 wheeled) Transfers: Sit to/from Stand Sit to Stand: Supervision         General transfer comment: verbal cueing for hand placement during sit <---> stand  and frequent cueing to limit weight bearing on LLE  Ambulation/Gait Ambulation/Gait assistance: Supervision Gait Distance (Feet): 90 Feet Assistive device: Rolling walker (2 wheeled) Gait Pattern/deviations: Step-through pattern;Decreased step length - right;Decreased step length - left;Decreased stride length Gait velocity: decreased   General Gait Details: frequent verbal cueing to limit weight on LLE to 50% by using UE on walker, min cueing to stay inside RW  Stairs            Wheelchair Mobility    Modified Rankin (Stroke Patients Only)       Balance Overall balance assessment: Needs assistance Sitting-balance support: No upper extremity supported;Feet supported Sitting balance-Leahy Scale: Normal Sitting balance - Comments: seated EOB   Standing balance support: Bilateral upper extremity supported Standing balance-Leahy Scale: Good Standing balance comment: using RW  Pertinent Vitals/Pain Pain Assessment: No/denies pain    Home Living Family/patient expects to be discharged to:: Private residence Living Arrangements: Alone Available Help at Discharge:  Family;Friend(s);Available PRN/intermittently;Neighbor Type of Home: Apartment Home Access: Stairs to enter Entrance Stairs-Rails: Doctor, general practice of Steps: 7 Home Layout: One level Home Equipment: Walker - standard;Bedside commode;Cane - quad Additional Comments: Patient states she can probably go live with son until she recovers    Prior Function Level of Independence: Needs assistance   Gait / Transfers Assistance Needed: has been managing with standard walker, transferring to [facility]  ADL's / Homemaking Assistance Needed: assistance from son and neighbors with ADL        Hand Dominance   Dominant Hand: Right    Extremity/Trunk Assessment   Upper Extremity Assessment Upper Extremity Assessment: Overall WFL for tasks assessed    Lower Extremity Assessment Lower Extremity Assessment: Generalized weakness LLE Deficits / Details: knee immobilized in extension in Bledsoe brace       Communication   Communication: No difficulties  Cognition Arousal/Alertness: Awake/alert Behavior During Therapy: WFL for tasks assessed/performed Overall Cognitive Status: Within Functional Limits for tasks assessed                                        General Comments      Exercises     Assessment/Plan    PT Assessment Patient needs continued PT services  PT Problem List Decreased strength;Decreased range of motion;Decreased activity tolerance;Decreased balance;Decreased mobility;Decreased coordination;Decreased knowledge of use of DME;Decreased safety awareness;Decreased knowledge of precautions       PT Treatment Interventions DME instruction;Gait training;Stair training;Functional mobility training;Therapeutic activities;Therapeutic exercise;Balance training;Cognitive remediation;Patient/family education;Neuromuscular re-education    PT Goals (Current goals can be found in the Care Plan section)  Acute Rehab PT Goals Patient Stated Goal: wants  to return home when able PT Goal Formulation: With patient Time For Goal Achievement: 11/15/19 Potential to Achieve Goals: Good    Frequency Min 3X/week   Barriers to discharge        Co-evaluation               AM-PAC PT "6 Clicks" Mobility  Outcome Measure Help needed turning from your back to your side while in a flat bed without using bedrails?: None Help needed moving from lying on your back to sitting on the side of a flat bed without using bedrails?: None Help needed moving to and from a bed to a chair (including a wheelchair)?: A Little Help needed standing up from a chair using your arms (e.g., wheelchair or bedside chair)?: A Little Help needed to walk in hospital room?: A Little Help needed climbing 3-5 steps with a railing? : A Lot 6 Click Score: 19    End of Session Equipment Utilized During Treatment: Gait belt Activity Tolerance: Patient tolerated treatment well Patient left: in bed;with call bell/phone within reach;with bed alarm set Nurse Communication: Mobility status PT Visit Diagnosis: Unsteadiness on feet (R26.81);Other abnormalities of gait and mobility (R26.89);Muscle weakness (generalized) (M62.81)    Time: 3662-9476 PT Time Calculation (min) (ACUTE ONLY): 42 min   Charges:   PT Evaluation $PT Eval Low Complexity: 1 Low PT Treatments $Therapeutic Activity: 38-52 mins        3:38 PM, 11/01/19 Wyman Songster PT, DPT Physical Therapist at [facility]

## 2019-11-01 NOTE — Plan of Care (Signed)
  Problem: Acute Rehab PT Goals(only PT should resolve) Goal: Patient Will Transfer Sit To/From Stand Outcome: Progressing Flowsheets (Taken 11/01/2019 1540) Patient will transfer sit to/from stand: with modified independence Goal: Pt Will Transfer Bed To Chair/Chair To Bed Outcome: Progressing Flowsheets (Taken 11/01/2019 1540) Pt will Transfer Bed to Chair/Chair to Bed: with modified independence Goal: Pt Will Ambulate Outcome: Progressing Flowsheets (Taken 11/01/2019 1540) Pt will Ambulate:  > 125 feet  with modified independence  3:41 PM, 11/01/19 Mearl Latin PT, DPT Physical Therapist at [facility]

## 2019-11-01 NOTE — Progress Notes (Signed)
Subjective: She says she feels okay except she has a headache.  She has no other new complaints.  PT consultation is pending  Objective: Vital signs in last 24 hours: Temp:  [97.9 F (36.6 C)-99 F (37.2 C)] 98.1 F (36.7 C) (12/15 0603) Pulse Rate:  [66-73] 66 (12/15 0603) Resp:  [16] 16 (12/15 0603) BP: (128-141)/(66-72) 128/72 (12/15 0603) SpO2:  [97 %-99 %] 97 % (12/15 0603) Weight change:  Last BM Date: 10/30/19  Intake/Output from previous day: 12/14 0701 - 12/15 0700 In: 2353.2 [I.V.:2055.9; IV Piggyback:297.4] Out: 2350 [Urine:2350]  PHYSICAL EXAM General appearance: alert, cooperative and no distress Resp: clear to auscultation bilaterally Cardio: regular rate and rhythm, S1, S2 normal, no murmur, click, rub or gallop GI: soft, non-tender; bowel sounds normal; no masses,  no organomegaly Extremities: extremities normal, atraumatic, no cyanosis or edema  Lab Results:  Results for orders placed or performed during the hospital encounter of 10/29/19 (from the past 48 hour(s))  Urinalysis, Routine w reflex microscopic     Status: Abnormal   Collection Time: 10/30/19 10:28 AM  Result Value Ref Range   Color, Urine AMBER (A) YELLOW    Comment: BIOCHEMICALS MAY BE AFFECTED BY COLOR   APPearance CLOUDY (A) CLEAR   Specific Gravity, Urine 1.016 1.005 - 1.030   pH 5.0 5.0 - 8.0   Glucose, UA NEGATIVE NEGATIVE mg/dL   Hgb urine dipstick LARGE (A) NEGATIVE   Bilirubin Urine NEGATIVE NEGATIVE   Ketones, ur 20 (A) NEGATIVE mg/dL   Protein, ur 30 (A) NEGATIVE mg/dL   Nitrite NEGATIVE NEGATIVE   Leukocytes,Ua MODERATE (A) NEGATIVE   RBC / HPF 21-50 0 - 5 RBC/hpf   WBC, UA >50 (H) 0 - 5 WBC/hpf   Bacteria, UA RARE (A) NONE SEEN   Squamous Epithelial / LPF 0-5 0 - 5   WBC Clumps PRESENT    Mucus PRESENT     Comment: Performed at [facility], [address] [place], [place] [address]  SARS CORONAVIRUS 2 (TAT 6-24 HRS) Nasopharyngeal Nasopharyngeal Swab     Status: None   Collection Time: 10/30/19 12:40 PM   Specimen: Nasopharyngeal Swab  Result Value Ref Range   SARS Coronavirus 2 NEGATIVE NEGATIVE    Comment: (NOTE) SARS-CoV-2 target nucleic acids are NOT DETECTED. The SARS-CoV-2 RNA is generally detectable in upper and lower respiratory specimens during the acute phase of infection. Negative results do not preclude SARS-CoV-2 infection, do not rule out co-infections with other pathogens, and should not be used as the sole basis for treatment or other patient management decisions. Negative results must be combined with clinical observations, patient history, and epidemiological information. The expected result is Negative. Fact Sheet for Patients: HairSlick.nohttps://www.fda.gov/media/138098/download Fact Sheet for Healthcare Providers: quierodirigir.comhttps://www.fda.gov/media/138095/download This test is not yet approved or cleared by the [place] FDA and  has been authorized for detection and/or diagnosis of SARS-CoV-2 by FDA under an Emergency Use Authorization (EUA). This EUA will remain  in effect (meaning this test can be used) for the duration of the COVID-19 declaration under Section 56 4(b)(1) of the Act, 21 U.S.C. section 360bbb-3(b)(1), unless the authorization is terminated or revoked sooner. Performed at [facility], [address] [place], [place] [address]   CBC with Differential     Status: Abnormal   Collection Time: 10/31/19  5:56 AM  Result Value Ref Range   WBC 4.1 4.0 - 10.5 K/uL   RBC 3.90 3.87 - 5.11 MIL/uL   Hemoglobin 11.7 (  L) 12.0 - 15.0 g/dL   HCT 37.8 36.0 - 46.0 %   MCV 96.9 80.0 - 100.0 fL   MCH 30.0 26.0 - 34.0 pg   MCHC 31.0 30.0 - 36.0 g/dL   RDW 12.0 11.5 - 15.5 %   Platelets 173 150 - 400 K/uL   nRBC 0.0 0.0 - 0.2 %   Neutrophils Relative % 40 %   Neutro Abs 1.6 (L) 1.7 - 7.7 K/uL   Lymphocytes Relative 43 %   Lymphs Abs 1.7 0.7 - 4.0 K/uL   Monocytes Relative 17 %   Monocytes Absolute 0.7 0.1 - 1.0 K/uL    Eosinophils Relative 0 %   Eosinophils Absolute 0.0 0.0 - 0.5 K/uL   Basophils Relative 0 %   Basophils Absolute 0.0 0.0 - 0.1 K/uL   Immature Granulocytes 0 %   Abs Immature Granulocytes 0.01 0.00 - 0.07 K/uL    Comment: Performed at [facility], [address], [place], [place] [address]    ABGS No results for input(s): PHART, PO2ART, TCO2, HCO3 in the last 72 hours.  Invalid input(s): PCO2 CULTURES Recent Results (from the past 240 hour(s))  Blood culture (routine x 2)     Status: None (Preliminary result)   Collection Time: 10/30/19  1:37 AM   Specimen: Right Antecubital; Blood  Result Value Ref Range Status   Specimen Description RIGHT ANTECUBITAL  Final   Special Requests NONE  Final   Culture   Final    NO GROWTH 2 DAYS Performed at [facility], [address], [place], [place] [address]    Report Status PENDING  Incomplete  Blood culture (routine x 2)     Status: None (Preliminary result)   Collection Time: 10/30/19  1:56 AM   Specimen: BLOOD RIGHT HAND  Result Value Ref Range Status   Specimen Description BLOOD RIGHT HAND  Final   Special Requests NONE  Final   Culture   Final    NO GROWTH 2 DAYS Performed at [facility], [address] [place], [place] [address]    Report Status PENDING  Incomplete  SARS CORONAVIRUS 2 (TAT 6-24 HRS) Nasopharyngeal Nasopharyngeal Swab     Status: None   Collection Time: 10/30/19 12:40 PM   Specimen: Nasopharyngeal Swab  Result Value Ref Range Status   SARS Coronavirus 2 NEGATIVE NEGATIVE Final    Comment: (NOTE) SARS-CoV-2 target nucleic acids are NOT DETECTED. The SARS-CoV-2 RNA is generally detectable in upper and lower respiratory specimens during the acute phase of infection. Negative results do not preclude SARS-CoV-2 infection, do not rule out co-infections with other pathogens, and should not be used as the sole basis for treatment or other patient management decisions. Negative results must be combined with clinical  observations, patient history, and epidemiological information. The expected result is Negative. Fact Sheet for Patients: SugarRoll.be Fact Sheet for Healthcare Providers: https://www.woods-mathews.com/ This test is not yet approved or cleared by the [place] FDA and  has been authorized for detection and/or diagnosis of SARS-CoV-2 by FDA under an Emergency Use Authorization (EUA). This EUA will remain  in effect (meaning this test can be used) for the duration of the COVID-19 declaration under Section 56 4(b)(1) of the Act, 21 U.S.C. section 360bbb-3(b)(1), unless the authorization is terminated or revoked sooner. Performed at [facility], [place] [address], [place], [place] [address]    Studies/Results: No results found.  Medications:  Prior to Admission:  Medications Prior to Admission  Medication Sig  Dispense Refill Last Dose  . metoprolol tartrate (LOPRESSOR) 25 MG tablet Take 12.5 mg by mouth 2 (two) times daily.    Past Week at Unknown time  . ondansetron (ZOFRAN ODT) 4 MG disintegrating tablet Take 1 tablet (4 mg total) by mouth every 8 (eight) hours as needed. 20 tablet 0   . traMADol (ULTRAM) 50 MG tablet Take 1 tablet (50 mg total) by mouth every 6 (six) hours as needed. (Patient taking differently: Take 50 mg by mouth every 6 (six) hours as needed for moderate pain. ) 15 tablet 0 Past Week at Unknown time  . HYDROcodone-acetaminophen (NORCO) 5-325 MG tablet Take 1 tablet by mouth every 4 (four) hours as needed for moderate pain. 40 tablet 0   . XARELTO 20 MG TABS tablet Take 20 mg by mouth daily.      Scheduled: . metoprolol tartrate  12.5 mg Oral BID  . rivaroxaban  20 mg Oral q1800   Continuous: . sodium chloride 75 mL/hr at 11/01/19 0557  . cefTRIAXone (ROCEPHIN)  IV Stopped (10/31/19 1532)   FGH:[id number], ondansetron **OR** ondansetron (ZOFRAN) IV  Assesment: She was admitted with altered mental  status.  She probably has a UTI and culture is pending her mental status is better  She has history of hypertension which is well controlled  She has history of DVT and pulmonary embolism.  She has had recent surgery on her leg and she was essentially bedbound at home so was going to get PT to see her and see if she needs rehab Active Problems:   AMS (altered mental status)   UTI (urinary tract infection)    Plan: Continue treatments.  PT consultation.    LOS: 2 days   Fredirick Maudlin 11/01/2019, 8:53 AM

## 2019-11-02 LAB — URINE CULTURE: Culture: 80000 — AB

## 2019-11-02 LAB — RESPIRATORY PANEL BY RT PCR (FLU A&B, COVID)
Influenza A by PCR: NEGATIVE
Influenza B by PCR: NEGATIVE
SARS Coronavirus 2 by RT PCR: POSITIVE — AB

## 2019-11-02 LAB — SARS CORONAVIRUS 2 BY RT PCR (HOSPITAL ORDER, PERFORMED IN ~~LOC~~ HOSPITAL LAB): SARS Coronavirus 2: POSITIVE — AB

## 2019-11-02 LAB — CBC
HCT: 35.5 % — ABNORMAL LOW (ref 36.0–46.0)
Hemoglobin: 11.5 g/dL — ABNORMAL LOW (ref 12.0–15.0)
MCH: 30.1 pg (ref 26.0–34.0)
MCHC: 32.4 g/dL (ref 30.0–36.0)
MCV: 92.9 fL (ref 80.0–100.0)
Platelets: 169 10*3/uL (ref 150–400)
RBC: 3.82 MIL/uL — ABNORMAL LOW (ref 3.87–5.11)
RDW: 11.7 % (ref 11.5–15.5)
WBC: 3.5 10*3/uL — ABNORMAL LOW (ref 4.0–10.5)
nRBC: 0 % (ref 0.0–0.2)

## 2019-11-02 LAB — D-DIMER, QUANTITATIVE: D-Dimer, Quant: 1.84 ug/mL-FEU — ABNORMAL HIGH (ref 0.00–0.50)

## 2019-11-02 LAB — BASIC METABOLIC PANEL
Anion gap: 11 (ref 5–15)
BUN: 7 mg/dL — ABNORMAL LOW (ref 8–23)
CO2: 23 mmol/L (ref 22–32)
Calcium: 8.2 mg/dL — ABNORMAL LOW (ref 8.9–10.3)
Chloride: 98 mmol/L (ref 98–111)
Creatinine, Ser: 0.63 mg/dL (ref 0.44–1.00)
GFR calc Af Amer: >60 mL/min (ref >60)
GFR calc non Af Amer: >60 mL/min (ref >60)
Glucose, Bld: 92 mg/dL (ref 70–99)
Potassium: 3.3 mmol/L — ABNORMAL LOW (ref 3.5–5.1)
Sodium: 132 mmol/L — ABNORMAL LOW (ref 135–145)

## 2019-11-02 LAB — ABO/RH: ABO/RH(D): O POS

## 2019-11-02 LAB — MAGNESIUM: Magnesium: 2.4 mg/dL (ref 1.7–2.4)

## 2019-11-02 LAB — PHOSPHORUS: Phosphorus: 3.1 mg/dL (ref 2.5–4.6)

## 2019-11-02 LAB — LACTATE DEHYDROGENASE: LDH: 167 U/L (ref 98–192)

## 2019-11-02 MED ORDER — POTASSIUM CHLORIDE CRYS ER 20 MEQ PO TBCR
40.0000 meq | EXTENDED_RELEASE_TABLET | Freq: Once | ORAL | Status: AC
  Administered 2019-11-02: 10:00:00 40 meq via ORAL
  Filled 2019-11-02: qty 2

## 2019-11-02 MED ORDER — SODIUM CHLORIDE 0.9 % IV SOLN
100.0000 mg | Freq: Every day | INTRAVENOUS | Status: DC
  Filled 2019-11-02 (×2): qty 20

## 2019-11-02 MED ORDER — SODIUM CHLORIDE 0.9 % IV SOLN
200.0000 mg | Freq: Once | INTRAVENOUS | Status: DC
  Filled 2019-11-02: qty 40

## 2019-11-02 MED ORDER — POTASSIUM CHLORIDE ER 20 MEQ PO TBCR: 20.0000 meq | EXTENDED_RELEASE_TABLET | Freq: Every day | ORAL | 1 refills | Status: DC

## 2019-11-02 NOTE — Progress Notes (Signed)
Authorization ID for [facility] [address] [place] [address] is [id number], reference ID [id number] 3 days beginning today 11-02-19 Review date is 11/04/19 [facility] is [employer]

## 2019-11-02 NOTE — Progress Notes (Signed)
Notified Dr. Luan Pulling, patient has a 6 beat run of V. Tach HR in 150s, patient is asymptomatic.

## 2019-11-02 NOTE — Progress Notes (Signed)
IV Remdesivir not yet admin'd due to no IV site at this time. Attempted IV start x2 without success. SWOT nurse in to attempt, also without success. ED has been contacted to come and utilize ultrasound to obtain IV site.

## 2019-11-02 NOTE — Care Management Important Message (Signed)
Important Message  Patient Details  Name: [name] Long MRN: [id number] Date of Birth: Jun 01, 1941   Medicare Important Message Given:  Yes     Tommy Medal 11/02/2019, 1:33 PM

## 2019-11-02 NOTE — Progress Notes (Signed)
Physical Therapy Treatment Patient Details Name: [name] Long MRN: [id number] DOB: 01-13-1941 Today's Date: 11/02/2019    History of Present Illness [name] Long  is a 78 y.o. female, with history of biliary pancreatitis, hypertension, solitary left kidney, pulmonary embolism, peripheral vascular disease who had left knee surgery, ORIF left patella for displaced transverse fracture of patella, 2 weeks ago, initially came to ED in the evening when home health aide said patient had.  Of confusion felt that she was unsafe to be at home alone.  She was transported to ED by EMS.  By the time patient came to ED she was back to her baseline.  Patient was discharged home.  When patient went to her son's house for approximately 10 minutes she became confused and incontinent.  As per son patient became confused and did not recognize him or know what was going on.  She was brought back to the hospital.    PT Comments    Patient overall more fatigued today compared to last session and requires min assist throughout today. She requires assist for LLE movement with bed mobility and min assist to transition to standing with RW. She requires min verbal cueing for hand placement on walker. She reports intermittent dizziness during treatment today. She has c/o feeling weaker as well. She demonstrates impaired standing tolerance and requires frequent rest breaks after all exercise/activity. She is limited to a few small steps at bedside with RW and min assist secondary to fatigue. She is left in chair at bedside - RN present.  Patient will benefit from continued physical therapy in hospital and recommended venue below to increase strength, balance, endurance for safe ADLs and gait.   Follow Up Recommendations  SNF;Supervision/Assistance - 24 hour;Supervision for mobility/OOB     Equipment Recommendations  None recommended by PT    Recommendations for Other Services       Precautions / Restrictions  Precautions Precautions: Fall Required Braces or Orthoses: Other Brace(hinge knee brace) Restrictions Weight Bearing Restrictions: Yes LLE Weight Bearing: Partial weight bearing LLE Partial Weight Bearing Percentage or Pounds: 50 Other Position/Activity Restrictions: Knee extension at all times    Mobility  Bed Mobility Overal bed mobility: Needs Assistance Bed Mobility: Supine to Sit;Sit to Supine     Supine to sit: Min assist Sit to supine: Min assist   General bed mobility comments: increased time, requires assist with moving LLE  Transfers Overall transfer level: Needs assistance Equipment used: Rolling walker (2 wheeled) Transfers: Sit to/from [facility] Sit to Stand: Min assist Stand pivot transfers: Min assist       General transfer comment: verbal cueing for hand placement during sit <---> stand  and frequent cueing to limit weight bearing on LLE, fatigues quickly upon standing today and felt weak  Ambulation/Gait Ambulation/Gait assistance: Min assist Gait Distance (Feet): 2 Feet Assistive device: Rolling walker (2 wheeled) Gait Pattern/deviations: Step-through pattern;Decreased step length - right;Decreased step length - left;Decreased stride length Gait velocity: decreased   General Gait Details: frequent verbal cueing to limit weight on LLE to 50% by using UE on walker, able to ambulate a few small steps today due to fatigue   Stairs             Wheelchair Mobility    Modified Rankin (Stroke Patients Only)       Balance Overall balance assessment: Needs assistance Sitting-balance support: No upper extremity supported;Feet supported Sitting balance-Leahy Scale: Good Sitting balance - Comments: seated EOB   Standing  balance support: Bilateral upper extremity supported Standing balance-Leahy Scale: Fair Standing balance comment: using RW, requires min assist for safety due to feeling weak with intermittent dizziness                             Cognition Arousal/Alertness: Awake/alert Behavior During Therapy: WFL for tasks assessed/performed Overall Cognitive Status: Within Functional Limits for tasks assessed                                        Exercises General Exercises - Lower Extremity Ankle Circles/Pumps: AROM;Supine;Both;15 reps Long Arc Quad: AROM;Right;15 reps;Seated Straight Leg Raises: AAROM;AROM;Both;10 reps;Supine(AAROM on LLE) Hip Flexion/Marching: AROM;Right;Seated;15 reps Toe Raises: AROM;Both;15 reps;Seated Heel Raises: AROM;Both;15 reps;Seated    General Comments        Pertinent Vitals/Pain Pain Assessment: No/denies pain    Home Living                      Prior Function            PT Goals (current goals can now be found in the care plan section) Acute Rehab PT Goals Patient Stated Goal: wants to return home when able PT Goal Formulation: With patient Time For Goal Achievement: 11/15/19 Potential to Achieve Goals: Good Progress towards PT goals: Progressing toward goals    Frequency    Min 3X/week      PT Plan Current plan remains appropriate    Co-evaluation              AM-PAC PT "6 Clicks" Mobility   Outcome Measure  Help needed turning from your back to your side while in a flat bed without using bedrails?: None Help needed moving from lying on your back to sitting on the side of a flat bed without using bedrails?: A Little Help needed moving to and from a bed to a chair (including a wheelchair)?: A Little Help needed standing up from a chair using your arms (e.g., wheelchair or bedside chair)?: A Little Help needed to walk in hospital room?: A Lot Help needed climbing 3-5 steps with a railing? : A Lot 6 Click Score: 17    End of Session Equipment Utilized During Treatment: Gait belt Activity Tolerance: Patient tolerated treatment well Patient left: with call bell/phone within reach;in chair;with chair  alarm set;with nursing/sitter in room Nurse Communication: Mobility status PT Visit Diagnosis: Unsteadiness on feet (R26.81);Other abnormalities of gait and mobility (R26.89);Muscle weakness (generalized) (M62.81)     Time: 8676-1950 PT Time Calculation (min) (ACUTE ONLY): 26 min  Charges:  $Therapeutic Exercise: 8-22 mins $Therapeutic Activity: 8-22 mins                    10:13 AM, 11/02/19 Wyman Songster PT, DPT Physical Therapist at [facility]

## 2019-11-02 NOTE — Discharge Summary (Deleted)
Physician Discharge Summary  Patient ID: [name] Long MRN: [id number] DOB/AGE: May 29, 1941 78 y.o. Primary Care Physician:Amalie Koran, Ramon Dredge, MD Admit date: 10/29/2019 Discharge date: 11/02/2019    Discharge Diagnoses:   Active Problems:   AMS (altered mental status)   UTI (urinary tract infection) Pulmonary emboli Recent open reduction internal fixation of patella fracture Hypokalemia Metabolic encephalopathy Allergies as of 11/02/2019   No Known Allergies     Medication List    STOP taking these medications   traMADol 50 MG tablet Commonly known as: ULTRAM     TAKE these medications   HYDROcodone-acetaminophen 5-325 MG tablet Commonly known as: Norco Take 1 tablet by mouth every 4 (four) hours as needed for moderate pain.   metoprolol tartrate 25 MG tablet Commonly known as: LOPRESSOR Take 12.5 mg by mouth 2 (two) times daily.   ondansetron 4 MG disintegrating tablet Commonly known as: Zofran ODT Take 1 tablet (4 mg total) by mouth every 8 (eight) hours as needed.   Potassium Chloride ER 20 MEQ Tbcr Take 20 mEq by mouth daily.   Xarelto 20 MG Tabs tablet Generic drug: rivaroxaban Take 20 mg by mouth daily.       Discharged Condition: Improved    Consults: None  Significant Diagnostic Studies: DG Knee 1-2 Views Left  Result Date: 10/25/2019 CLINICAL DATA:  ORIF left patella Radiation Safety Timeout performed by TMS LMP verified prior to exam, TMS Tech wore surgical mask/unable to visualize ptSurgery elective EXAM: LEFT KNEE - 1-2 VIEW; DG C-ARM 1-60 MIN COMPARISON:  10/13/2019 FINDINGS: Patient has undergone ORIF of the patella with 2 cortical screws. No new fracture. IMPRESSION: ORIF of the patella. Electronically Signed   By: Norva Pavlov M.D.   On: 10/25/2019 15:37   CT Head Wo Contrast  Result Date: 10/29/2019 CLINICAL DATA:  Confusion EXAM: CT HEAD WITHOUT CONTRAST TECHNIQUE: Contiguous axial images were obtained from the base of the skull  through the vertex without intravenous contrast. COMPARISON:  09/26/2016 FINDINGS: Brain: Mild atrophic changes are noted commenced with the patient's given age. No findings to suggest acute hemorrhage or space-occupying mass lesion are noted. Scattered white matter ischemic changes are noted. Vascular: No hyperdense vessel or unexpected calcification. Skull: Normal. Negative for fracture or focal lesion. Sinuses/Orbits: No acute finding. Other: None. IMPRESSION: Mild atrophic and ischemic changes without acute abnormality. Electronically Signed   By: Alcide Clever M.D.   On: 10/29/2019 23:37   DG Chest Port 1 View  Result Date: 10/29/2019 CLINICAL DATA:  Confusion and shortness of breath EXAM: PORTABLE CHEST 1 VIEW COMPARISON:  12/01/17 FINDINGS: Cardiac shadow is stable. Aortic calcifications are again seen. The lungs are well aerated bilaterally. No focal infiltrate or sizable effusion is seen. Degenerative changes of the shoulder joints are noted bilaterally. IMPRESSION: No acute abnormality noted. Aortic Atherosclerosis (ICD10-I70.0). Electronically Signed   By: Alcide Clever M.D.   On: 10/29/2019 23:38   DG Knee Complete 4 Views Left  Result Date: 10/13/2019 CLINICAL DATA:  LEFT knee pain fall. EXAM: LEFT KNEE - COMPLETE 4+ VIEW COMPARISON:  None. FINDINGS: There is a horizontal fracture through the mid patella. Suprapatellar joint effusion noted. No fracture of the tibial plateau. Femur fracture IMPRESSION: Horizontal fracture through the patella. Joint effusion Electronically Signed   By: Genevive Bi M.D.   On: 10/13/2019 07:48   DG C-Arm 1-60 Min  Result Date: 10/25/2019 CLINICAL DATA:  ORIF left patella Radiation Safety Timeout performed by TMS LMP verified prior to exam, TMS Tech  wore surgical mask/unable to visualize ptSurgery elective EXAM: LEFT KNEE - 1-2 VIEW; DG C-ARM 1-60 MIN COMPARISON:  10/13/2019 FINDINGS: Patient has undergone ORIF of the patella with 2 cortical screws. No new  fracture. IMPRESSION: ORIF of the patella. Electronically Signed   By: Nolon Nations M.D.   On: 10/25/2019 15:37    Lab Results: Basic Metabolic Panel: Recent Labs    11/01/19 2048 11/02/19 0451  NA  --  132*  K 3.5 3.3*  CL  --  98  CO2  --  23  GLUCOSE  --  92  BUN  --  7*  CREATININE  --  0.63  CALCIUM  --  8.2*  MG 1.4* 2.4  PHOS  --  3.1   Liver Function Tests: No results for input(s): AST, ALT, ALKPHOS, BILITOT, PROT, ALBUMIN in the last 72 hours.   CBC: Recent Labs    10/31/19 0556 11/02/19 0451  WBC 4.1 3.5*  NEUTROABS 1.6*  --   HGB 11.7* 11.5*  HCT 37.8 35.5*  MCV 96.9 92.9  PLT 173 169    Recent Results (from the past 240 hour(s))  Blood culture (routine x 2)     Status: None (Preliminary result)   Collection Time: 10/30/19  1:37 AM   Specimen: Right Antecubital; Blood  Result Value Ref Range Status   Specimen Description RIGHT ANTECUBITAL  Final   Special Requests NONE  Final   Culture   Final    NO GROWTH 3 DAYS Performed at [facility], [address], [place], [place] [address]    Report Status PENDING  Incomplete  Blood culture (routine x 2)     Status: None (Preliminary result)   Collection Time: 10/30/19  1:56 AM   Specimen: BLOOD RIGHT HAND  Result Value Ref Range Status   Specimen Description BLOOD RIGHT HAND  Final   Special Requests NONE  Final   Culture   Final    NO GROWTH 3 DAYS Performed at [facility], [address], [place], [place] [address]    Report Status PENDING  Incomplete  Urine culture     Status: Abnormal   Collection Time: 10/30/19 10:28 AM   Specimen: Urine, Clean Catch  Result Value Ref Range Status   Specimen Description   Final    URINE, CLEAN CATCH Performed at [facility], [address], [place], [place] [address]    Special Requests   Final    NONE Performed at [facility], [address], [place],  [address]    Culture 80,000 COLONIES/mL ESCHERICHIA COLI (A)  Final   Report Status  11/02/2019 FINAL  Final   Organism ID, Bacteria ESCHERICHIA COLI (A)  Final      Susceptibility   Escherichia coli - MIC*    AMPICILLIN <=2 SENSITIVE Sensitive     CEFAZOLIN <=4 SENSITIVE Sensitive     CEFTRIAXONE <=1 SENSITIVE Sensitive     CIPROFLOXACIN <=0.25 SENSITIVE Sensitive     GENTAMICIN <=1 SENSITIVE Sensitive     IMIPENEM <=0.25 SENSITIVE Sensitive     NITROFURANTOIN <=16 SENSITIVE Sensitive     TRIMETH/SULFA <=20 SENSITIVE Sensitive     AMPICILLIN/SULBACTAM <=2 SENSITIVE Sensitive     PIP/TAZO <=4 SENSITIVE Sensitive     * 80,000 COLONIES/mL ESCHERICHIA COLI  SARS CORONAVIRUS 2 (TAT 6-24 HRS) Nasopharyngeal Nasopharyngeal Swab     Status: None   Collection Time: 10/30/19 12:40 PM   Specimen: Nasopharyngeal Swab  Result Value Ref Range Status  SARS Coronavirus 2 NEGATIVE NEGATIVE Final    Comment: (NOTE) SARS-CoV-2 target nucleic acids are NOT DETECTED. The SARS-CoV-2 RNA is generally detectable in upper and lower respiratory specimens during the acute phase of infection. Negative results do not preclude SARS-CoV-2 infection, do not rule out co-infections with other pathogens, and should not be used as the sole basis for treatment or other patient management decisions. Negative results must be combined with clinical observations, patient history, and epidemiological information. The expected result is Negative. Fact Sheet for Patients: HairSlick.nohttps://www.fda.gov/media/138098/download Fact Sheet for Healthcare Providers: quierodirigir.comhttps://www.fda.gov/media/138095/download This test is not yet approved or cleared by the [place] FDA and  has been authorized for detection and/or diagnosis of SARS-CoV-2 by FDA under an Emergency Use Authorization (EUA). This EUA will remain  in effect (meaning this test can be used) for the duration of the COVID-19 declaration under Section 56 4(b)(1) of the Act, 21 U.S.C. section 360bbb-3(b)(1), unless the authorization is terminated or revoked  sooner. Performed at [facility], [address] [place], [place] [address]      Hospital Course: She came to the hospital because of altered mental status.  This seem to be waxing and waning.  She was noted to be immobile and was having difficulty getting out of bed at home.  She had recent left patella fracture and had surgery for that.  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  She has hypertension and that is been controlled.  She was evaluated felt to have a urinary tract infection which was treated.  She eventually grew E. coli which was pansensitive.  She had no further episodes of altered mental status.  She had PT evaluation it was felt she should go to skilled care facility.  By the time of discharge she was much improved.  Discharge Exam: Blood pressure (!) 134/58, pulse 63, temperature 98.9 F (37.2 C), temperature source Oral, resp. rate 18, height 5\' 2"  (1.575 m), weight 50.3 kg, SpO2 100 %. She is awake and alert.  Chest is clear.  Heart is regular  Disposition: To skilled care facility    Contact information for after-discharge care    Destination    [facility]erred [facility] .   Service: Skilled Nursing Contact information: [address] [place] [id number] [phone]              Signed: Fredirick Maudlindward L Arkie Tagliaferro   11/02/2019, 9:50 AM

## 2019-11-02 NOTE — Progress Notes (Signed)
Subjective: She says she feels okay.  Plan is for transfer to skilled care facility.  She had a 6 beat asymptomatic run of V. tach.  Her potassium was 3.3 so I will replace that but I do not think that we will keep her from going to the skilled care facility.  She is growing 80,000 colonies of E. coli which is pansensitive  Objective: Vital signs in last 24 hours: Temp:  [98.5 F (36.9 C)-99.3 F (37.4 C)] 98.9 F (37.2 C) (12/16 0443) Pulse Rate:  [61-75] 63 (12/16 0443) Resp:  [17-20] 18 (12/16 0443) BP: (126-136)/(58-80) 134/58 (12/16 0443) SpO2:  [93 %-100 %] 100 % (12/16 0443) Weight change:  Last BM Date: 10/30/19  Intake/Output from previous day: 12/15 0701 - 12/16 0700 In: 2557.4 [P.O.:180; I.V.:2377.4] Out: 1400 [Urine:1400]  PHYSICAL EXAM General appearance: alert, cooperative and no distress Resp: clear to auscultation bilaterally Cardio: regular rate and rhythm, S1, S2 normal, no murmur, click, rub or gallop GI: soft, non-tender; bowel sounds normal; no masses,  no organomegaly Extremities: extremities normal, atraumatic, no cyanosis or edema  Lab Results:  Results for orders placed or performed during the hospital encounter of 10/29/19 (from the past 48 hour(s))  Magnesium     Status: Abnormal   Collection Time: 11/01/19  8:48 PM  Result Value Ref Range   Magnesium 1.4 (L) 1.7 - 2.4 mg/dL    Comment: Performed at [facility], [address], [place], [place] [address]  Potassium     Status: None   Collection Time: 11/01/19  8:48 PM  Result Value Ref Range   Potassium 3.5 3.5 - 5.1 mmol/L    Comment: Performed at [facility], [address], [place], [place] [address]  Basic metabolic panel     Status: Abnormal   Collection Time: 11/02/19  4:51 AM  Result Value Ref Range   Sodium 132 (L) 135 - 145 mmol/L   Potassium 3.3 (L) 3.5 - 5.1 mmol/L   Chloride 98 98 - 111 mmol/L   CO2 23 22 - 32 mmol/L   Glucose, Bld 92 70 - 99 mg/dL   BUN 7 (L) 8 - 23 mg/dL    Creatinine, Ser 3.87 0.44 - 1.00 mg/dL   Calcium 8.2 (L) 8.9 - 10.3 mg/dL   GFR calc non Af Amer >60 >60 mL/min   GFR calc Af Amer >60 >60 mL/min   Anion gap 11 5 - 15    Comment: Performed at [facility], [address] [place], [place] [address]  CBC     Status: Abnormal   Collection Time: 11/02/19  4:51 AM  Result Value Ref Range   WBC 3.5 (L) 4.0 - 10.5 K/uL   RBC 3.82 (L) 3.87 - 5.11 MIL/uL   Hemoglobin 11.5 (L) 12.0 - 15.0 g/dL   HCT 29.5 (L) 18.8 - 41.6 %   MCV 92.9 80.0 - 100.0 fL   MCH 30.1 26.0 - 34.0 pg   MCHC 32.4 30.0 - 36.0 g/dL   RDW 60.6 30.1 - 60.1 %   Platelets 169 150 - 400 K/uL   nRBC 0.0 0.0 - 0.2 %    Comment: Performed at [facility], [address], [place], [place] [address]  Magnesium     Status: None   Collection Time: 11/02/19  4:51 AM  Result Value Ref Range   Magnesium 2.4 1.7 - 2.4 mg/dL    Comment: Performed at [facility], [address] [place], [place] [address]  Phosphorus  Status: None   Collection Time: 11/02/19  4:51 AM  Result Value Ref Range   Phosphorus 3.1 2.5 - 4.6 mg/dL    Comment: Performed at [facility], [address], [place], [place] [address]    ABGS No results for input(s): PHART, PO2ART, TCO2, HCO3 in the last 72 hours.  Invalid input(s): PCO2 CULTURES Recent Results (from the past 240 hour(s))  Blood culture (routine x 2)     Status: None (Preliminary result)   Collection Time: 10/30/19  1:37 AM   Specimen: Right Antecubital; Blood  Result Value Ref Range Status   Specimen Description RIGHT ANTECUBITAL  Final   Special Requests NONE  Final   Culture   Final    NO GROWTH 3 DAYS Performed at [facility], [address], [place], [place] [address]    Report Status PENDING  Incomplete  Blood culture (routine x 2)     Status: None (Preliminary result)   Collection Time: 10/30/19  1:56 AM   Specimen: BLOOD RIGHT HAND  Result Value Ref Range Status   Specimen Description BLOOD RIGHT HAND  Final   Special  Requests NONE  Final   Culture   Final    NO GROWTH 3 DAYS Performed at [facility], [address], [place], [place] [address]    Report Status PENDING  Incomplete  Urine culture     Status: Abnormal   Collection Time: 10/30/19 10:28 AM   Specimen: Urine, Clean Catch  Result Value Ref Range Status   Specimen Description   Final    URINE, CLEAN CATCH Performed at [facility], [address] [place], [place] [address]    Special Requests   Final    NONE Performed at [facility], [address] [place], [place] [address]    Culture 80,000 COLONIES/mL ESCHERICHIA COLI (A)  Final   Report Status 11/02/2019 FINAL  Final   Organism ID, Bacteria ESCHERICHIA COLI (A)  Final      Susceptibility   Escherichia coli - MIC*    AMPICILLIN <=2 SENSITIVE Sensitive     CEFAZOLIN <=4 SENSITIVE Sensitive     CEFTRIAXONE <=1 SENSITIVE Sensitive     CIPROFLOXACIN <=0.25 SENSITIVE Sensitive     GENTAMICIN <=1 SENSITIVE Sensitive     IMIPENEM <=0.25 SENSITIVE Sensitive     NITROFURANTOIN <=16 SENSITIVE Sensitive     TRIMETH/SULFA <=20 SENSITIVE Sensitive     AMPICILLIN/SULBACTAM <=2 SENSITIVE Sensitive     PIP/TAZO <=4 SENSITIVE Sensitive     * 80,000 COLONIES/mL ESCHERICHIA COLI  SARS CORONAVIRUS 2 (TAT 6-24 HRS) Nasopharyngeal Nasopharyngeal Swab     Status: None   Collection Time: 10/30/19 12:40 PM   Specimen: Nasopharyngeal Swab  Result Value Ref Range Status   SARS Coronavirus 2 NEGATIVE NEGATIVE Final    Comment: (NOTE) SARS-CoV-2 target nucleic acids are NOT DETECTED. The SARS-CoV-2 RNA is generally detectable in upper and lower respiratory specimens during the acute phase of infection. Negative results do not preclude SARS-CoV-2 infection, do not rule out co-infections with other pathogens, and should not be used as the sole basis for treatment or other patient management decisions. Negative results must be combined with clinical observations, patient history, and epidemiological  information. The expected result is Negative. Fact Sheet for Patients: SugarRoll.be Fact Sheet for Healthcare Providers: https://www.woods-mathews.com/ This test is not yet approved or cleared by the [place] FDA and  has been authorized for detection and/or diagnosis of SARS-CoV-2 by FDA under an Emergency Use Authorization (EUA).  This EUA will remain  in effect (meaning this test can be used) for the duration of the COVID-19 declaration under Section 56 4(b)(1) of the Act, 21 U.S.C. section 360bbb-3(b)(1), unless the authorization is terminated or revoked sooner. Performed at [facility], [address] [place], [place] [address]    Studies/Results: No results found.  Medications:  Prior to Admission:  Medications Prior to Admission  Medication Sig Dispense Refill Last Dose  . metoprolol tartrate (LOPRESSOR) 25 MG tablet Take 12.5 mg by mouth 2 (two) times daily.    Past Week at Unknown time  . ondansetron (ZOFRAN ODT) 4 MG disintegrating tablet Take 1 tablet (4 mg total) by mouth every 8 (eight) hours as needed. 20 tablet 0   . traMADol (ULTRAM) 50 MG tablet Take 1 tablet (50 mg total) by mouth every 6 (six) hours as needed. (Patient taking differently: Take 50 mg by mouth every 6 (six) hours as needed for moderate pain. ) 15 tablet 0 Past Week at Unknown time  . HYDROcodone-acetaminophen (NORCO) 5-325 MG tablet Take 1 tablet by mouth every 4 (four) hours as needed for moderate pain. 40 tablet 0   . XARELTO 20 MG TABS tablet Take 20 mg by mouth daily.      Scheduled: . metoprolol tartrate  12.5 mg Oral BID  . rivaroxaban  20 mg Oral q1800   Continuous: . sodium chloride 75 mL/hr at 11/01/19 2043  . cefTRIAXone (ROCEPHIN)  IV 1 g (11/02/19 0819)   UEA:[id number]:HYDROcodone-acetaminophen, ondansetron **OR** ondansetron (ZOFRAN) IV  Assesment: She was admitted with altered mental status.  This is related to a UTI with 80,000 E.  Coli. Her mental status is back to normal.  She had recent surgery for her knee fracture and she is severely deconditioned and she is going to need rehab  She has hypertension which is pretty well controlled  She has personal history of pulmonary emboli and she is on Xarelto.  This was recurrent Active Problems:   AMS (altered mental status)   UTI (urinary tract infection)    Plan: Transfer to skilled care facility today    LOS: 3 days   [name] Long 11/02/2019, 9:20 AM

## 2019-11-03 DIAGNOSIS — E860 Dehydration: Secondary | ICD-10-CM | POA: Diagnosis not present

## 2019-11-03 DIAGNOSIS — R197 Diarrhea, unspecified: Secondary | ICD-10-CM | POA: Diagnosis present

## 2019-11-03 DIAGNOSIS — N189 Chronic kidney disease, unspecified: Secondary | ICD-10-CM | POA: Diagnosis not present

## 2019-11-03 DIAGNOSIS — R52 Pain, unspecified: Secondary | ICD-10-CM | POA: Diagnosis not present

## 2019-11-03 DIAGNOSIS — S82002D Unspecified fracture of left patella, subsequent encounter for closed fracture with routine healing: Secondary | ICD-10-CM | POA: Diagnosis not present

## 2019-11-03 DIAGNOSIS — Z7901 Long term (current) use of anticoagulants: Secondary | ICD-10-CM | POA: Diagnosis not present

## 2019-11-03 DIAGNOSIS — E872 Acidosis: Secondary | ICD-10-CM | POA: Diagnosis not present

## 2019-11-03 DIAGNOSIS — I10 Essential (primary) hypertension: Secondary | ICD-10-CM | POA: Diagnosis not present

## 2019-11-03 DIAGNOSIS — N390 Urinary tract infection, site not specified: Secondary | ICD-10-CM | POA: Diagnosis not present

## 2019-11-03 DIAGNOSIS — U071 COVID-19: Secondary | ICD-10-CM | POA: Diagnosis not present

## 2019-11-03 DIAGNOSIS — R404 Transient alteration of awareness: Secondary | ICD-10-CM | POA: Diagnosis not present

## 2019-11-03 DIAGNOSIS — Z743 Need for continuous supervision: Secondary | ICD-10-CM | POA: Diagnosis not present

## 2019-11-03 DIAGNOSIS — A09 Infectious gastroenteritis and colitis, unspecified: Secondary | ICD-10-CM | POA: Diagnosis not present

## 2019-11-03 DIAGNOSIS — B9620 Unspecified Escherichia coli [E. coli] as the cause of diseases classified elsewhere: Secondary | ICD-10-CM | POA: Diagnosis not present

## 2019-11-03 DIAGNOSIS — R0689 Other abnormalities of breathing: Secondary | ICD-10-CM | POA: Diagnosis not present

## 2019-11-03 DIAGNOSIS — R000 Tachycardia, unspecified: Secondary | ICD-10-CM | POA: Diagnosis not present

## 2019-11-03 DIAGNOSIS — R0902 Hypoxemia: Secondary | ICD-10-CM | POA: Diagnosis not present

## 2019-11-03 DIAGNOSIS — Z4789 Encounter for other orthopedic aftercare: Secondary | ICD-10-CM | POA: Diagnosis not present

## 2019-11-03 DIAGNOSIS — N179 Acute kidney failure, unspecified: Secondary | ICD-10-CM | POA: Diagnosis not present

## 2019-11-03 DIAGNOSIS — Z86711 Personal history of pulmonary embolism: Secondary | ICD-10-CM | POA: Diagnosis not present

## 2019-11-03 DIAGNOSIS — I129 Hypertensive chronic kidney disease with stage 1 through stage 4 chronic kidney disease, or unspecified chronic kidney disease: Secondary | ICD-10-CM | POA: Diagnosis not present

## 2019-11-03 DIAGNOSIS — I739 Peripheral vascular disease, unspecified: Secondary | ICD-10-CM | POA: Diagnosis not present

## 2019-11-03 DIAGNOSIS — I252 Old myocardial infarction: Secondary | ICD-10-CM | POA: Diagnosis not present

## 2019-11-03 DIAGNOSIS — S82033D Displaced transverse fracture of unspecified patella, subsequent encounter for closed fracture with routine healing: Secondary | ICD-10-CM | POA: Diagnosis not present

## 2019-11-03 DIAGNOSIS — Z8616 Personal history of COVID-19: Secondary | ICD-10-CM | POA: Diagnosis not present

## 2019-11-03 DIAGNOSIS — A0472 Enterocolitis due to Clostridium difficile, not specified as recurrent: Secondary | ICD-10-CM | POA: Diagnosis not present

## 2019-11-03 DIAGNOSIS — Z7401 Bed confinement status: Secondary | ICD-10-CM | POA: Diagnosis not present

## 2019-11-03 LAB — CBC
HCT: 38.8 % (ref 36.0–46.0)
Hemoglobin: 12.2 g/dL (ref 12.0–15.0)
MCH: 29.4 pg (ref 26.0–34.0)
MCHC: 31.4 g/dL (ref 30.0–36.0)
MCV: 93.5 fL (ref 80.0–100.0)
Platelets: 184 10*3/uL (ref 150–400)
RBC: 4.15 MIL/uL (ref 3.87–5.11)
RDW: 11.6 % (ref 11.5–15.5)
WBC: 3.4 10*3/uL — ABNORMAL LOW (ref 4.0–10.5)
nRBC: 0 % (ref 0.0–0.2)

## 2019-11-03 LAB — COMPREHENSIVE METABOLIC PANEL
ALT: 14 U/L (ref 0–44)
AST: 25 U/L (ref 15–41)
Albumin: 2.9 g/dL — ABNORMAL LOW (ref 3.5–5.0)
Alkaline Phosphatase: 41 U/L (ref 38–126)
Anion gap: 14 (ref 5–15)
BUN: 10 mg/dL (ref 8–23)
CO2: 19 mmol/L — ABNORMAL LOW (ref 22–32)
Calcium: 8.3 mg/dL — ABNORMAL LOW (ref 8.9–10.3)
Chloride: 97 mmol/L — ABNORMAL LOW (ref 98–111)
Creatinine, Ser: 0.73 mg/dL (ref 0.44–1.00)
GFR calc Af Amer: >60 mL/min (ref >60)
GFR calc non Af Amer: >60 mL/min (ref >60)
Glucose, Bld: 93 mg/dL (ref 70–99)
Potassium: 4 mmol/L (ref 3.5–5.1)
Sodium: 130 mmol/L — ABNORMAL LOW (ref 135–145)
Total Bilirubin: 1 mg/dL (ref 0.3–1.2)
Total Protein: 6.7 g/dL (ref 6.5–8.1)

## 2019-11-03 LAB — MAGNESIUM: Magnesium: 1.7 mg/dL (ref 1.7–2.4)

## 2019-11-03 MED ORDER — AMOXICILLIN 250 MG PO CAPS
500.0000 mg | ORAL_CAPSULE | Freq: Three times a day (TID) | ORAL | Status: DC
  Filled 2019-11-03 (×2): qty 1

## 2019-11-03 MED ORDER — AMOXICILLIN 500 MG PO CAPS
500.0000 mg | ORAL_CAPSULE | Freq: Three times a day (TID) | ORAL | Status: DC
  Filled 2019-11-03 (×5): qty 1

## 2019-11-03 MED ORDER — AMOXICILLIN 500 MG PO TABS: 500.0000 mg | ORAL_TABLET | Freq: Three times a day (TID) | ORAL | 0 refills | Status: AC

## 2019-11-03 NOTE — Discharge Summary (Signed)
Physician Discharge Summary  Patient ID: [name] Long MRN: [id number] DOB/AGE: [birth year] 80 y.o. Primary Care Physician:Kedra Mcglade, Ramon Dredge, MD Admit date: 10/29/2019 Discharge date: 11/03/2019    Discharge Diagnoses:   Active Problems:   AMS (altered mental status)   UTI (urinary tract infection) [phone] Recent patellar fracture Personal history of pulmonary emboli SVT hypertension   Allergies as of 11/03/2019   No Known Allergies     Medication List    STOP taking these medications   traMADol 50 MG tablet Commonly known as: ULTRAM     TAKE these medications   amoxicillin 500 MG tablet Commonly known as: AMOXIL Take 1 tablet (500 mg total) by mouth 3 (three) times daily for 5 days.   HYDROcodone-acetaminophen 5-325 MG tablet Commonly known as: Norco Take 1 tablet by mouth every 4 (four) hours as needed for moderate pain.   metoprolol tartrate 25 MG tablet Commonly known as: LOPRESSOR Take 12.5 mg by mouth 2 (two) times daily.   ondansetron 4 MG disintegrating tablet Commonly known as: Zofran ODT Take 1 tablet (4 mg total) by mouth every 8 (eight) hours as needed.   Potassium Chloride ER 20 MEQ Tbcr Take 20 mEq by mouth daily.   Xarelto 20 MG Tabs tablet Generic drug: rivaroxaban Take 20 mg by mouth daily.       Discharged Condition:improved    Consults:none  Significant Diagnostic Studies: DG Knee 1-2 Views Left  Result Date: 10/25/2019 CLINICAL DATA:  ORIF left patella Radiation Safety Timeout performed by TMS LMP verified prior to exam, TMS Tech wore surgical mask/unable to visualize ptSurgery elective EXAM: LEFT KNEE - 1-2 VIEW; DG C-ARM 1-60 MIN COMPARISON:  10/13/2019 FINDINGS: Patient has undergone ORIF of the patella with 2 cortical screws. No new fracture. IMPRESSION: ORIF of the patella. Electronically Signed   By: Norva Pavlov M.D.   On: 10/25/2019 15:37   CT Head Wo Contrast  Result Date: 10/29/2019 CLINICAL DATA:  Confusion EXAM:  CT HEAD WITHOUT CONTRAST TECHNIQUE: Contiguous axial images were obtained from the base of the skull through the vertex without intravenous contrast. COMPARISON:  09/26/2016 FINDINGS: Brain: Mild atrophic changes are noted commenced with the patient's given age. No findings to suggest acute hemorrhage or space-occupying mass lesion are noted. Scattered white matter ischemic changes are noted. Vascular: No hyperdense vessel or unexpected calcification. Skull: Normal. Negative for fracture or focal lesion. Sinuses/Orbits: No acute finding. Other: None. IMPRESSION: Mild atrophic and ischemic changes without acute abnormality. Electronically Signed   By: Alcide Clever M.D.   On: 10/29/2019 23:37   DG Chest Port 1 View  Result Date: 10/29/2019 CLINICAL DATA:  Confusion and shortness of breath EXAM: PORTABLE CHEST 1 VIEW COMPARISON:  12/01/17 FINDINGS: Cardiac shadow is stable. Aortic calcifications are again seen. The lungs are well aerated bilaterally. No focal infiltrate or sizable effusion is seen. Degenerative changes of the shoulder joints are noted bilaterally. IMPRESSION: No acute abnormality noted. Aortic Atherosclerosis (ICD10-I70.0). Electronically Signed   By: Alcide Clever M.D.   On: 10/29/2019 23:38   DG Knee Complete 4 Views Left  Result Date: 10/13/2019 CLINICAL DATA:  LEFT knee pain fall. EXAM: LEFT KNEE - COMPLETE 4+ VIEW COMPARISON:  None. FINDINGS: There is a horizontal fracture through the mid patella. Suprapatellar joint effusion noted. No fracture of the tibial plateau. Femur fracture IMPRESSION: Horizontal fracture through the patella. Joint effusion Electronically Signed   By: Genevive Bi M.D.   On: 10/13/2019 07:48   DG C-Arm 1-60 Min  Result Date: 10/25/2019 CLINICAL DATA:  ORIF left patella Radiation Safety Timeout performed by [place] LMP verified prior to exam, [place] Tech wore surgical mask/unable to visualize ptSurgery elective EXAM: LEFT KNEE - 1-2 VIEW; DG C-ARM 1-60 MIN  COMPARISON:  10/13/2019 FINDINGS: Patient has undergone ORIF of the patella with 2 cortical screws. No new fracture. IMPRESSION: ORIF of the patella. Electronically Signed   By: Nolon Nations M.D.   On: 10/25/2019 15:37    Lab Results: Basic Metabolic Panel: Recent Labs    11/02/19 0451 11/03/19 0643  NA 132* 130*  K 3.3* 4.0  CL 98 97*  CO2 23 19*  GLUCOSE 92 93  BUN 7* 10  CREATININE 0.63 0.73  CALCIUM 8.2* 8.3*  MG 2.4 1.7  PHOS 3.1  --    Liver Function Tests: Recent Labs    11/03/19 0643  AST 25  ALT 14  ALKPHOS 41  BILITOT 1.0  PROT 6.7  ALBUMIN 2.9*     CBC: Recent Labs    11/02/19 0451 11/03/19 0643  WBC 3.5* 3.4*  HGB 11.5* 12.2  HCT 35.5* 38.8  MCV 92.9 93.5  PLT 169 184    Recent Results (from the past 240 hour(s))  Blood culture (routine x 2)     Status: None (Preliminary result)   Collection Time: 10/30/19  1:37 AM   Specimen: Right Antecubital; Blood  Result Value Ref Range Status   Specimen Description RIGHT ANTECUBITAL  Final   Special Requests   Final    BOTTLES DRAWN AEROBIC AND ANAEROBIC Blood Culture results may not be optimal due to an inadequate volume of blood received in culture bottles   Culture   Final    NO GROWTH 4 DAYS Performed at [facility], [address] [place], [place] [address]    Report Status PENDING  Incomplete  Blood culture (routine x 2)     Status: None (Preliminary result)   Collection Time: 10/30/19  1:56 AM   Specimen: BLOOD RIGHT HAND  Result Value Ref Range Status   Specimen Description BLOOD RIGHT HAND  Final   Special Requests   Final    BOTTLES DRAWN AEROBIC AND ANAEROBIC Blood Culture results may not be optimal due to an inadequate volume of blood received in culture bottles   Culture   Final    NO GROWTH 4 DAYS Performed at [facility], [address], [place], [place] [address]    Report Status PENDING  Incomplete  Urine culture     Status: Abnormal   Collection Time: 10/30/19 10:28 AM    Specimen: Urine, Clean Catch  Result Value Ref Range Status   Specimen Description   Final    URINE, CLEAN CATCH Performed at [facility], [address] [place], [place] [address]    Special Requests   Final    NONE Performed at [facility], [address], [place],  [address]    Culture 80,000 COLONIES/mL ESCHERICHIA COLI (A)  Final   Report Status 11/02/2019 FINAL  Final   Organism ID, Bacteria ESCHERICHIA COLI (A)  Final      Susceptibility   Escherichia coli - MIC*    AMPICILLIN <=2 SENSITIVE Sensitive     CEFAZOLIN <=4 SENSITIVE Sensitive     CEFTRIAXONE <=1 SENSITIVE Sensitive     CIPROFLOXACIN <=0.25 SENSITIVE Sensitive     GENTAMICIN <=1 SENSITIVE Sensitive     IMIPENEM <=0.25 SENSITIVE Sensitive     NITROFURANTOIN <=16 SENSITIVE Sensitive  TRIMETH/SULFA <=20 SENSITIVE Sensitive     AMPICILLIN/SULBACTAM <=2 SENSITIVE Sensitive     PIP/TAZO <=4 SENSITIVE Sensitive     * 80,000 COLONIES/mL ESCHERICHIA COLI  SARS CORONAVIRUS 2 (TAT 6-24 HRS) Nasopharyngeal Nasopharyngeal Swab     Status: None   Collection Time: 10/30/19 12:40 PM   Specimen: Nasopharyngeal Swab  Result Value Ref Range Status   SARS Coronavirus 2 NEGATIVE NEGATIVE Final    Comment: (NOTE) SARS-CoV-2 target nucleic acids are NOT DETECTED. The SARS-CoV-2 RNA is generally detectable in upper and lower respiratory specimens during the acute phase of infection. Negative results do not preclude SARS-CoV-2 infection, do not rule out co-infections with other pathogens, and should not be used as the sole basis for treatment or other patient management decisions. Negative results must be combined with clinical observations, patient history, and epidemiological information. The expected result is Negative. Fact Sheet for Patients: HairSlick.nohttps://www.fda.gov/media/138098/download Fact Sheet for Healthcare Providers: quierodirigir.comhttps://www.fda.gov/media/138095/download This test is not yet approved or cleared by the  [place] FDA and  has been authorized for detection and/or diagnosis of SARS-CoV-2 by FDA under an Emergency Use Authorization (EUA). This EUA will remain  in effect (meaning this test can be used) for the duration of the COVID-19 declaration under Section 56 4(b)(1) of the Act, 21 U.S.C. section 360bbb-3(b)(1), unless the authorization is terminated or revoked sooner. Performed at [facility], [address] [place], [place] [address]   Respiratory Panel by RT PCR (Flu A&B, Covid) - Nasopharyngeal Swab     Status: Abnormal   Collection Time: 11/02/19 11:45 AM   Specimen: Nasopharyngeal Swab  Result Value Ref Range Status   SARS Coronavirus 2 by RT PCR POSITIVE (A) NEGATIVE Corrected    Comment: RESULT CALLED TO, READ BACK BY AND VERIFIED WITH: MARTIN,D. AT 1403 ON 11/02/2019 BY BAUGHAM,M CORRECTED ON 12/16 AT 1414: PREVIOUSLY REPORTED AS POSITIVE RESULT CALLED TO, READ BACK BY AND VERIFIED WITH: MOORE,M. AT 1250 ON 11/02/2019 BY BAUGHAM,M.    Influenza A by PCR NEGATIVE NEGATIVE Final   Influenza B by PCR NEGATIVE NEGATIVE Final    Comment: (NOTE) The Xpert Xpress SARS-CoV-2/FLU/RSV assay is intended as an aid in  the diagnosis of influenza from Nasopharyngeal swab specimens and  should not be used as a sole basis for treatment. Nasal washings and  aspirates are unacceptable for Xpert Xpress SARS-CoV-2/FLU/RSV  testing. Fact Sheet for Patients: https://www.moore.com/https://www.fda.gov/media/142436/download Fact Sheet for Healthcare Providers: https://www.young.biz/https://www.fda.gov/media/142435/download This test is not yet approved or cleared by the [place] FDA and  has been authorized for detection and/or diagnosis of SARS-CoV-2 by  FDA under an Emergency Use Authorization (EUA). This EUA will remain  in effect (meaning this test can be used) for the duration of the  Covid-19 declaration under Section 564(b)(1) of the Act, 21  U.S.C. section 360bbb-3(b)(1), unless the authorization is  terminated  or revoked. Performed at [facility], [address] [place], [place] [address]   SARS Coronavirus 2 by RT PCR (hospital order, performed in [facility] hospital lab)     Status: Abnormal   Collection Time: 11/02/19  2:00 PM  Result Value Ref Range Status   SARS Coronavirus 2 POSITIVE (A) NEGATIVE Final    Comment: RESULT CALLED TO, READ BACK BY AND VERIFIED WITH: HOWERTON,M ON 11/02/19 AT 1540 BY LOY,C (NOTE) SARS-CoV-2 target nucleic acids are DETECTED SARS-CoV-2 RNA is generally detectable in upper respiratory specimens  during the acute phase of infection.  Positive results are indicative  of  the presence of the identified virus, but do not rule out bacterial infection or co-infection with other pathogens not detected by the test.  Clinical correlation with patient history and  other diagnostic information is necessary to determine patient infection status.  The expected result is negative. Fact Sheet for Patients:   BoilerBrush.com.cy  Fact Sheet for Healthcare Providers:   https://pope.com/   This test is not yet approved or cleared by the [place] FDA and  has been authorized for detection and/or diagnosis of SARS-CoV-2 by FDA under an Emergency Use Authorization (EUA).  This EUA will remain in effect (meaning this test  can be used) for the duration of  the COVID-19 declaration under Section 564(b)(1) of the Act, 21 U.S.C. section 360-bbb-3(b)(1), unless the authorization is terminated or revoked sooner. Performed at [facility], [address] [place], [place] [address]      Hospital Course: this is an 78 yo who came in with intermittent episodes of AMS. She had what appeared to be a UTI based on u/a in ED and grew E. Coli. She had physical therapy consult and SNF for rehab was recommended. She was set up for this but discharge was delayed because of positive test for Covid19.she was asymptomatic. No shortness of breath  ,diarrhea or other symptoms. No oxygen requirement. She was accepted for 11/03/2019  Discharge Exam: Blood pressure 105/62, pulse (!) 53, temperature 99.5 F (37.5 C), temperature source Oral, resp. rate 20, height 5\' 2"  (1.575 m), weight 50.3 kg, SpO2 98 %. She is awake and alert. Not confused. No distress.lungs are clear  Disposition: to SNF for rehab. She will be on heart healthy diet, needs PT ,OT and speech as necessary    Contact information for after-discharge care    Destination    [facility] Preferred SNF .   Service: Skilled Nursing Contact information: [address] [place] [id number]              Signed: [phone]   11/03/2019, 11:04 AM

## 2019-11-03 NOTE — TOC Transition Note (Addendum)
Transition of Care [facility]) - CM/SW Discharge Note   Patient Details  Name: LABREA ECCLESTON MRN: [id number] Date of Birth: 1941-04-10  [facility] CM/SW Contact:  Deairra Halleck, Chauncey Reading, RN Phone Number: 11/03/2019, 10:59 AM   Clinical Narrative:   Patient is now covid positive. Can no longer transfer to [facility]. [facility] can make a bed offer, patient agreeable. Son updated. Will send DC clinicals via HUB. Son taking clothes to [facility]. Transferring ins auth to [place] center.   ADDENDUM : Insurance authorization # is [id number]. EMS transport arranged for 3 pm.   Final next level of care: [place] Barriers to Discharge: Barriers Resolved   Patient Goals and CMS Choice Patient states their goals for this hospitalization and ongoing recovery are:: to go to rehab then back home. CMS Medicare.gov Compare Post Acute Care list provided to:: Patient Choice offered to / list presented to : Patient  Discharge Placement              Patient chooses bed at: [facility] Patient to be transferred to facility by: EMS Name of family member notified: Vickey Sages Patient and family notified of of transfer: 11/03/19     Social Determinants of Health ([place]) Interventions     Readmission Risk Interventions No flowsheet data found.

## 2019-11-03 NOTE — Progress Notes (Signed)
Remdesivir to be discontinued with agreement from MD.  Patient did not receive 1st dose due to IV access issues and patient is currently asymptomatic without oxygen requirements.  Will monitor if patient needs remdesivir in the future.

## 2019-11-03 NOTE — Progress Notes (Signed)
Central tele called at [phone] patient has 13 beat run of SVT. Upon entering room to check on patient, she awakened easily with no complaints reported. Tele monitor now shows heart rate of 77. MD A. Zierle Orlin Hilding notified with no new orders at this time.Will continue to monitor.

## 2019-11-03 NOTE — Progress Notes (Signed)
Central tele called and said that patient had a 17 beat run of SVT. Went to check on patient she is asymptomatic. New set of vital signs were taking, BP continues to be soft 105/62. Temp 99.5 RR 20 Heart rate 77 O2 98% on RA. On call MD was notified and called to check on patient. No new orders placed. Please refer to MD Zierle- Ghosh's progress note.

## 2019-11-03 NOTE — Progress Notes (Signed)
Patient has had two runs of vtach tonight, one that was 16 beats, and one that was 17 beats. BPs have been stable with SBP in the low 100s. Monitor electrolytes on this morning's bloodwork, continue metoprolol, continue to monitor on tele.

## 2019-11-03 NOTE — Progress Notes (Signed)
Authorization ID for [facility] [address], [place], [place] [address] is [id number], [id number] for 5 days beginning today, 11/03/19 Review date is 11/07/19 [facility] is [employer]

## 2019-11-03 NOTE — Progress Notes (Signed)
Subjective: She had COVID-19 testing prior to transfer to skilled care facility and she was positive.  She is now in a negative pressure room.  She is not requiring oxygen.  She does not have any respiratory symptoms.  No diarrhea.  The episodes of SVT are noted potassium this morning is 4.  Her D-dimer was 1.84 but with no respiratory symptoms I am not going to repeat a CT.  Objective: Vital signs in last 24 hours: Temp:  [97.8 F (36.6 C)-99.5 F (37.5 C)] 99.5 F (37.5 C) (12/17 0502) Pulse Rate:  [53-79] 53 (12/17 0502) Resp:  [17-20] 20 (12/17 0502) BP: (105-133)/(57-71) 105/62 (12/17 0502) SpO2:  [98 %-100 %] 98 % (12/17 0502) Weight change:  Last BM Date: 10/30/19  Intake/Output from previous day: 12/16 0701 - 12/17 0700 In: 267 [I.V.:264.4; IV Piggyback:2.6] Out: 2000 [Urine:2000]  PHYSICAL EXAM General appearance: alert, cooperative and no distress Resp: clear to auscultation bilaterally Cardio: regular rate and rhythm, S1, S2 normal, no murmur, click, rub or gallop GI: soft, non-tender; bowel sounds normal; no masses,  no organomegaly Extremities: extremities normal, atraumatic, no cyanosis or edema  Lab Results:  Results for orders placed or performed during the hospital encounter of 10/29/19 (from the past 48 hour(s))  Magnesium     Status: Abnormal   Collection Time: 11/01/19  8:48 PM  Result Value Ref Range   Magnesium 1.4 (L) 1.7 - 2.4 mg/dL    Comment: Performed at [facility], [address] [place], [place] [address]  Potassium     Status: None   Collection Time: 11/01/19  8:48 PM  Result Value Ref Range   Potassium 3.5 3.5 - 5.1 mmol/L    Comment: Performed at [facility], [address], [place], [place] [address]  Basic metabolic panel     Status: Abnormal   Collection Time: 11/02/19  4:51 AM  Result Value Ref Range   Sodium 132 (L) 135 - 145 mmol/L   Potassium 3.3 (L) 3.5 - 5.1 mmol/L   Chloride 98 98 - 111 mmol/L   CO2 23 22 - 32 mmol/L    Glucose, Bld 92 70 - 99 mg/dL   BUN 7 (L) 8 - 23 mg/dL   Creatinine, Ser 8.29 0.44 - 1.00 mg/dL   Calcium 8.2 (L) 8.9 - 10.3 mg/dL   GFR calc non Af Amer >60 >60 mL/min   GFR calc Af Amer >60 >60 mL/min   Anion gap 11 5 - 15    Comment: Performed at [facility], [address] [place], [place] [address]  CBC     Status: Abnormal   Collection Time: 11/02/19  4:51 AM  Result Value Ref Range   WBC 3.5 (L) 4.0 - 10.5 K/uL   RBC 3.82 (L) 3.87 - 5.11 MIL/uL   Hemoglobin 11.5 (L) 12.0 - 15.0 g/dL   HCT 08.6 (L) 57.8 - 46.9 %   MCV 92.9 80.0 - 100.0 fL   MCH 30.1 26.0 - 34.0 pg   MCHC 32.4 30.0 - 36.0 g/dL   RDW 62.9 52.8 - 41.3 %   Platelets 169 150 - 400 K/uL   nRBC 0.0 0.0 - 0.2 %    Comment: Performed at [facility], [address], [place], [place] [address]  Magnesium     Status: None   Collection Time: 11/02/19  4:51 AM  Result Value Ref Range   Magnesium 2.4 1.7 - 2.4 mg/dL    Comment: Performed at [facility], [address], [place], [place] [address]  Phosphorus     Status: None   Collection Time: 11/02/19  4:51 AM  Result Value Ref Range   Phosphorus 3.1 2.5 - 4.6 mg/dL    Comment: Performed at [facility], [address] [place], [place] [address]  Respiratory Panel by RT PCR (Flu A&B, Covid) - Nasopharyngeal Swab     Status: Abnormal   Collection Time: 11/02/19 11:45 AM   Specimen: Nasopharyngeal Swab  Result Value Ref Range   SARS Coronavirus 2 by RT PCR POSITIVE (A) NEGATIVE    Comment: RESULT CALLED TO, READ BACK BY AND VERIFIED WITH: MARTIN,D. AT 1403 ON 11/02/2019 BY BAUGHAM,M CORRECTED ON 12/16 AT 1414: PREVIOUSLY REPORTED AS POSITIVE RESULT CALLED TO, READ BACK BY AND VERIFIED WITH: MOORE,M. AT 1250 ON 11/02/2019 BY BAUGHAM,M.    Influenza A by PCR NEGATIVE NEGATIVE   Influenza B by PCR NEGATIVE NEGATIVE    Comment: (NOTE) The Xpert Xpress SARS-CoV-2/FLU/RSV assay is intended as an aid in  the diagnosis of influenza from Nasopharyngeal swab specimens and   should not be used as a sole basis for treatment. Nasal washings and  aspirates are unacceptable for Xpert Xpress SARS-CoV-2/FLU/RSV  testing. Fact Sheet for Patients: https://www.moore.com/ Fact Sheet for Healthcare Providers: https://www.young.biz/ This test is not yet approved or cleared by the [place] FDA and  has been authorized for detection and/or diagnosis of SARS-CoV-2 by  FDA under an Emergency Use Authorization (EUA). This EUA will remain  in effect (meaning this test can be used) for the duration of the  Covid-19 declaration under Section 564(b)(1) of the Act, 21  U.S.C. section 360bbb-3(b)(1), unless the authorization is  terminated or revoked. Performed at [facility], [address], [place], [place] [address]   SARS Coronavirus 2 by RT PCR (hospital order, performed in [facility] hospital lab)     Status: Abnormal   Collection Time: 11/02/19  2:00 PM  Result Value Ref Range   SARS Coronavirus 2 POSITIVE (A) NEGATIVE    Comment: RESULT CALLED TO, READ BACK BY AND VERIFIED WITH: HOWERTON,M ON 11/02/19 AT 1540 BY LOY,C (NOTE) SARS-CoV-2 target nucleic acids are DETECTED SARS-CoV-2 RNA is generally detectable in upper respiratory specimens  during the acute phase of infection.  Positive results are indicative  of the presence of the identified virus, but do not rule out bacterial infection or co-infection with other pathogens not detected by the test.  Clinical correlation with patient history and  other diagnostic information is necessary to determine patient infection status.  The expected result is negative. Fact Sheet for Patients:   BoilerBrush.com.cy  Fact Sheet for Healthcare Providers:   https://pope.com/   This test is not yet approved or cleared by the [place] FDA and  has been authorized for detection and/or diagnosis of SARS-CoV-2 by FDA under an Emergency  Use Authorization (EUA).  This EUA will remain in effect (meaning this test  can be used) for the duration of  the COVID-19 declaration under Section 564(b)(1) of the Act, 21 U.S.C. section 360-bbb-3(b)(1), unless the authorization is terminated or revoked sooner. Performed at [facility], [address] [place], [place] [address]   D-dimer, quantitative (not at [facility])     Status: Abnormal   Collection Time: 11/02/19  5:31 PM  Result Value Ref Range   D-Dimer, Quant 1.84 (H) 0.00 - 0.50 ug/mL-FEU    Comment: (NOTE) At the manufacturer cut-off of 0.50 ug/mL FEU, this assay has been documented to  exclude PE with a sensitivity and negative predictive value of 97 to 99%.  At this time, this assay has not been approved by the FDA to exclude DVT/VTE. Results should be correlated with clinical presentation. Performed at [facility], [address], [place], [place] [address]   Lactate dehydrogenase     Status: None   Collection Time: 11/02/19  5:31 PM  Result Value Ref Range   LDH 167 98 - 192 U/L    Comment: Performed at [facility], [address], [place], [place] [address]  ABO/Rh     Status: None   Collection Time: 11/02/19  5:32 PM  Result Value Ref Range   ABO/RH(D)      O POS Performed at [facility], [address] [place], [place] [address]   CBC     Status: Abnormal   Collection Time: 11/03/19  6:43 AM  Result Value Ref Range   WBC 3.4 (L) 4.0 - 10.5 K/uL   RBC 4.15 3.87 - 5.11 MIL/uL   Hemoglobin 12.2 12.0 - 15.0 g/dL   HCT 46.9 62.9 - 52.8 %   MCV 93.5 80.0 - 100.0 fL   MCH 29.4 26.0 - 34.0 pg   MCHC 31.4 30.0 - 36.0 g/dL   RDW 41.3 24.4 - 01.0 %   Platelets 184 150 - 400 K/uL   nRBC 0.0 0.0 - 0.2 %    Comment: Performed at [facility], [address] [place], [place] [address]  Comprehensive metabolic panel     Status: Abnormal   Collection Time: 11/03/19  6:43 AM  Result Value Ref Range   Sodium 130 (L) 135 - 145 mmol/L   Potassium 4.0 3.5 - 5.1 mmol/L     Comment: DELTA CHECK NOTED   Chloride 97 (L) 98 - 111 mmol/L   CO2 19 (L) 22 - 32 mmol/L   Glucose, Bld 93 70 - 99 mg/dL   BUN 10 8 - 23 mg/dL   Creatinine, Ser 6.64 0.44 - 1.00 mg/dL   Calcium 8.3 (L) 8.9 - 10.3 mg/dL   Total Protein 6.7 6.5 - 8.1 g/dL   Albumin 2.9 (L) 3.5 - 5.0 g/dL   AST 25 15 - 41 U/L   ALT 14 0 - 44 U/L   Alkaline Phosphatase 41 38 - 126 U/L   Total Bilirubin 1.0 0.3 - 1.2 mg/dL   GFR calc non Af Amer >60 >60 mL/min   GFR calc Af Amer >60 >60 mL/min   Anion gap 14 5 - 15    Comment: Performed at [facility], [address], [place], [place] [address]  Magnesium     Status: None   Collection Time: 11/03/19  6:43 AM  Result Value Ref Range   Magnesium 1.7 1.7 - 2.4 mg/dL    Comment: Performed at [facility], [address], [place], [place] [address]    ABGS No results for input(s): PHART, PO2ART, TCO2, HCO3 in the last 72 hours.  Invalid input(s): PCO2 CULTURES Recent Results (from the past 240 hour(s))  Blood culture (routine x 2)     Status: None (Preliminary result)   Collection Time: 10/30/19  1:37 AM   Specimen: Right Antecubital; Blood  Result Value Ref Range Status   Specimen Description RIGHT ANTECUBITAL  Final   Special Requests NONE  Final   Culture   Final    NO GROWTH 4 DAYS Performed at [facility], [address], [place], [place] [address]    Report Status PENDING  Incomplete  Blood culture (routine x 2)     Status: None (Preliminary result)   Collection Time: 10/30/19  1:56 AM   Specimen: BLOOD RIGHT HAND  Result Value Ref Range Status   Specimen Description BLOOD RIGHT HAND  Final   Special Requests NONE  Final   Culture   Final    NO GROWTH 4 DAYS Performed at [facility], [address] [place], [place] [address]    Report Status PENDING  Incomplete  Urine culture     Status: Abnormal   Collection Time: 10/30/19 10:28 AM   Specimen: Urine, Clean Catch  Result Value Ref Range Status   Specimen Description   Final     URINE, CLEAN CATCH Performed at [facility], [address] [place], [place] [address]    Special Requests   Final    NONE Performed at [facility], [address] [place], [place] [address]    Culture 80,000 COLONIES/mL ESCHERICHIA COLI (A)  Final   Report Status 11/02/2019 FINAL  Final   Organism ID, Bacteria ESCHERICHIA COLI (A)  Final      Susceptibility   Escherichia coli - MIC*    AMPICILLIN <=2 SENSITIVE Sensitive     CEFAZOLIN <=4 SENSITIVE Sensitive     CEFTRIAXONE <=1 SENSITIVE Sensitive     CIPROFLOXACIN <=0.25 SENSITIVE Sensitive     GENTAMICIN <=1 SENSITIVE Sensitive     IMIPENEM <=0.25 SENSITIVE Sensitive     NITROFURANTOIN <=16 SENSITIVE Sensitive     TRIMETH/SULFA <=20 SENSITIVE Sensitive     AMPICILLIN/SULBACTAM <=2 SENSITIVE Sensitive     PIP/TAZO <=4 SENSITIVE Sensitive     * 80,000 COLONIES/mL ESCHERICHIA COLI  SARS CORONAVIRUS 2 (TAT 6-24 HRS) Nasopharyngeal Nasopharyngeal Swab     Status: None   Collection Time: 10/30/19 12:40 PM   Specimen: Nasopharyngeal Swab  Result Value Ref Range Status   SARS Coronavirus 2 NEGATIVE NEGATIVE Final    Comment: (NOTE) SARS-CoV-2 target nucleic acids are NOT DETECTED. The SARS-CoV-2 RNA is generally detectable in upper and lower respiratory specimens during the acute phase of infection. Negative results do not preclude SARS-CoV-2 infection, do not rule out co-infections with other pathogens, and should not be used as the sole basis for treatment or other patient management decisions. Negative results must be combined with clinical observations, patient history, and epidemiological information. The expected result is Negative. Fact Sheet for Patients: HairSlick.nohttps://www.fda.gov/media/138098/download Fact Sheet for Healthcare Providers: quierodirigir.comhttps://www.fda.gov/media/138095/download This test is not yet approved or cleared by the [place] FDA and  has been authorized for detection and/or diagnosis of SARS-CoV-2 by FDA  under an Emergency Use Authorization (EUA). This EUA will remain  in effect (meaning this test can be used) for the duration of the COVID-19 declaration under Section 56 4(b)(1) of the Act, 21 U.S.C. section 360bbb-3(b)(1), unless the authorization is terminated or revoked sooner. Performed at [facility], [address] [place], [place] [address]   Respiratory Panel by RT PCR (Flu A&B, Covid) - Nasopharyngeal Swab     Status: Abnormal   Collection Time: 11/02/19 11:45 AM   Specimen: Nasopharyngeal Swab  Result Value Ref Range Status   SARS Coronavirus 2 by RT PCR POSITIVE (A) NEGATIVE Corrected    Comment: RESULT CALLED TO, READ BACK BY AND VERIFIED WITH: MARTIN,D. AT 1403 ON 11/02/2019 BY BAUGHAM,M CORRECTED ON 12/16 AT 1414: PREVIOUSLY REPORTED AS POSITIVE RESULT CALLED TO, READ BACK BY AND VERIFIED WITH: MOORE,M. AT 1250 ON 11/02/2019 BY BAUGHAM,M.  Influenza A by PCR NEGATIVE NEGATIVE Final   Influenza B by PCR NEGATIVE NEGATIVE Final    Comment: (NOTE) The Xpert Xpress SARS-CoV-2/FLU/RSV assay is intended as an aid in  the diagnosis of influenza from Nasopharyngeal swab specimens and  should not be used as a sole basis for treatment. Nasal washings and  aspirates are unacceptable for Xpert Xpress SARS-CoV-2/FLU/RSV  testing. Fact Sheet for Patients: https://www.moore.com/ Fact Sheet for Healthcare Providers: https://www.young.biz/ This test is not yet approved or cleared by the [place] FDA and  has been authorized for detection and/or diagnosis of SARS-CoV-2 by  FDA under an Emergency Use Authorization (EUA). This EUA will remain  in effect (meaning this test can be used) for the duration of the  Covid-19 declaration under Section 564(b)(1) of the Act, 21  U.S.C. section 360bbb-3(b)(1), unless the authorization is  terminated or revoked. Performed at [facility], [address], [place], [place] [address]   SARS  Coronavirus 2 by RT PCR (hospital order, performed in [facility] hospital lab)     Status: Abnormal   Collection Time: 11/02/19  2:00 PM  Result Value Ref Range Status   SARS Coronavirus 2 POSITIVE (A) NEGATIVE Final    Comment: RESULT CALLED TO, READ BACK BY AND VERIFIED WITH: HOWERTON,M ON 11/02/19 AT 1540 BY LOY,C (NOTE) SARS-CoV-2 target nucleic acids are DETECTED SARS-CoV-2 RNA is generally detectable in upper respiratory specimens  during the acute phase of infection.  Positive results are indicative  of the presence of the identified virus, but do not rule out bacterial infection or co-infection with other pathogens not detected by the test.  Clinical correlation with patient history and  other diagnostic information is necessary to determine patient infection status.  The expected result is negative. Fact Sheet for Patients:   BoilerBrush.com.cy  Fact Sheet for Healthcare Providers:   https://pope.com/   This test is not yet approved or cleared by the [place] FDA and  has been authorized for detection and/or diagnosis of SARS-CoV-2 by FDA under an Emergency Use Authorization (EUA).  This EUA will remain in effect (meaning this test  can be used) for the duration of  the COVID-19 declaration under Section 564(b)(1) of the Act, 21 U.S.C. section 360-bbb-3(b)(1), unless the authorization is terminated or revoked sooner. Performed at [facility], [address] [place], [place] [address]    Studies/Results: No results found.  Medications:  Prior to Admission:  Medications Prior to Admission  Medication Sig Dispense Refill Last Dose  . metoprolol tartrate (LOPRESSOR) 25 MG tablet Take 12.5 mg by mouth 2 (two) times daily.    Past Week at Unknown time  . ondansetron (ZOFRAN ODT) 4 MG disintegrating tablet Take 1 tablet (4 mg total) by mouth every 8 (eight) hours as needed. 20 tablet 0   . traMADol (ULTRAM) 50 MG tablet  Take 1 tablet (50 mg total) by mouth every 6 (six) hours as needed. (Patient taking differently: Take 50 mg by mouth every 6 (six) hours as needed for moderate pain. ) 15 tablet 0 Past Week at Unknown time  . HYDROcodone-acetaminophen (NORCO) 5-325 MG tablet Take 1 tablet by mouth every 4 (four) hours as needed for moderate pain. 40 tablet 0   . XARELTO 20 MG TABS tablet Take 20 mg by mouth daily.      Scheduled: . metoprolol tartrate  12.5 mg Oral BID  . rivaroxaban  20 mg Oral q1800   Continuous: . sodium chloride 75 mL/hr at 11/03/19  3295  . cefTRIAXone (ROCEPHIN)  IV 1 g (11/02/19 0819)  . remdesivir 200 mg in sodium chloride 0.9% 250 mL IVPB Stopped (11/02/19 2324)   Followed by  . remdesivir 100 mg in NS 100 mL     JOA:[id number], ondansetron **OR** ondansetron (ZOFRAN) IV  Assesment: She was admitted with altered mental status related to UTI.  She has E. coli urinary tract infection she is on Rocephin.  She has COVID-19 which is asymptomatic.  She is not requiring oxygen she does not have any respiratory symptoms.  She has hypertension and she is going to stay on metoprolol  She has had episodes of SVT which are asymptomatic and do not need any further treatment  She had a recent fracture of her patella and had surgery for that.  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  Plan had been for her to go to skilled care facility for rehab but that is been put on hold for now with her positive COVID-19 test    LOS: 4 days   [name] Long 11/03/2019, 8:37 AM

## 2019-11-04 LAB — CULTURE, BLOOD (ROUTINE X 2)
Culture: NO GROWTH
Culture: NO GROWTH

## 2019-11-04 LAB — VITAMIN B1: Vitamin B1 (Thiamine): 106 nmol/L (ref 66.5–200.0)

## 2019-11-05 DIAGNOSIS — I10 Essential (primary) hypertension: Secondary | ICD-10-CM | POA: Diagnosis not present

## 2019-11-05 DIAGNOSIS — U071 COVID-19: Secondary | ICD-10-CM | POA: Diagnosis not present

## 2019-11-05 DIAGNOSIS — S82002D Unspecified fracture of left patella, subsequent encounter for closed fracture with routine healing: Secondary | ICD-10-CM | POA: Diagnosis not present

## 2019-11-15 ENCOUNTER — Other Ambulatory Visit: Payer: Self-pay

## 2019-11-15 ENCOUNTER — Inpatient Hospital Stay (HOSPITAL_COMMUNITY)
Admission: EM | Admit: 2019-11-15 | Discharge: 2019-11-18 | DRG: 372 | Disposition: A | Discharge status: Skilled Nursing Facility | Payer: Medicare Other | Source: Skilled Nursing Facility | Attending: Internal Medicine | Admitting: Internal Medicine

## 2019-11-15 ENCOUNTER — Encounter (HOSPITAL_COMMUNITY): Payer: Self-pay | Admitting: Emergency Medicine

## 2019-11-15 DIAGNOSIS — A0471 Enterocolitis due to Clostridium difficile, recurrent: Secondary | ICD-10-CM | POA: Diagnosis not present

## 2019-11-15 DIAGNOSIS — B9620 Unspecified Escherichia coli [E. coli] as the cause of diseases classified elsewhere: Secondary | ICD-10-CM | POA: Diagnosis present

## 2019-11-15 DIAGNOSIS — N189 Chronic kidney disease, unspecified: Secondary | ICD-10-CM | POA: Diagnosis not present

## 2019-11-15 DIAGNOSIS — U071 COVID-19: Secondary | ICD-10-CM | POA: Diagnosis present

## 2019-11-15 DIAGNOSIS — I252 Old myocardial infarction: Secondary | ICD-10-CM

## 2019-11-15 DIAGNOSIS — Z7901 Long term (current) use of anticoagulants: Secondary | ICD-10-CM

## 2019-11-15 DIAGNOSIS — I129 Hypertensive chronic kidney disease with stage 1 through stage 4 chronic kidney disease, or unspecified chronic kidney disease: Secondary | ICD-10-CM | POA: Diagnosis not present

## 2019-11-15 DIAGNOSIS — Z8616 Personal history of COVID-19: Secondary | ICD-10-CM | POA: Diagnosis present

## 2019-11-15 DIAGNOSIS — A0472 Enterocolitis due to Clostridium difficile, not specified as recurrent: Principal | ICD-10-CM

## 2019-11-15 DIAGNOSIS — N179 Acute kidney failure, unspecified: Secondary | ICD-10-CM | POA: Diagnosis not present

## 2019-11-15 DIAGNOSIS — E860 Dehydration: Secondary | ICD-10-CM | POA: Diagnosis not present

## 2019-11-15 DIAGNOSIS — S82033D Displaced transverse fracture of unspecified patella, subsequent encounter for closed fracture with routine healing: Secondary | ICD-10-CM

## 2019-11-15 DIAGNOSIS — A09 Infectious gastroenteritis and colitis, unspecified: Secondary | ICD-10-CM | POA: Diagnosis not present

## 2019-11-15 DIAGNOSIS — N390 Urinary tract infection, site not specified: Secondary | ICD-10-CM | POA: Diagnosis not present

## 2019-11-15 DIAGNOSIS — R000 Tachycardia, unspecified: Secondary | ICD-10-CM | POA: Diagnosis not present

## 2019-11-15 DIAGNOSIS — Z743 Need for continuous supervision: Secondary | ICD-10-CM | POA: Diagnosis not present

## 2019-11-15 DIAGNOSIS — R5381 Other malaise: Secondary | ICD-10-CM | POA: Diagnosis not present

## 2019-11-15 DIAGNOSIS — R262 Difficulty in walking, not elsewhere classified: Secondary | ICD-10-CM | POA: Diagnosis not present

## 2019-11-15 DIAGNOSIS — R69 Illness, unspecified: Secondary | ICD-10-CM | POA: Diagnosis not present

## 2019-11-15 DIAGNOSIS — I739 Peripheral vascular disease, unspecified: Secondary | ICD-10-CM | POA: Diagnosis present

## 2019-11-15 DIAGNOSIS — R197 Diarrhea, unspecified: Secondary | ICD-10-CM

## 2019-11-15 DIAGNOSIS — Z4789 Encounter for other orthopedic aftercare: Secondary | ICD-10-CM | POA: Diagnosis not present

## 2019-11-15 DIAGNOSIS — E872 Acidosis: Secondary | ICD-10-CM | POA: Diagnosis not present

## 2019-11-15 DIAGNOSIS — Z86711 Personal history of pulmonary embolism: Secondary | ICD-10-CM | POA: Diagnosis not present

## 2019-11-15 DIAGNOSIS — M6281 Muscle weakness (generalized): Secondary | ICD-10-CM | POA: Diagnosis not present

## 2019-11-15 DIAGNOSIS — R0689 Other abnormalities of breathing: Secondary | ICD-10-CM | POA: Diagnosis not present

## 2019-11-15 DIAGNOSIS — I10 Essential (primary) hypertension: Secondary | ICD-10-CM | POA: Diagnosis not present

## 2019-11-15 DIAGNOSIS — S82002D Unspecified fracture of left patella, subsequent encounter for closed fracture with routine healing: Secondary | ICD-10-CM | POA: Diagnosis not present

## 2019-11-15 DIAGNOSIS — R52 Pain, unspecified: Secondary | ICD-10-CM | POA: Diagnosis not present

## 2019-11-15 DIAGNOSIS — R0902 Hypoxemia: Secondary | ICD-10-CM | POA: Diagnosis not present

## 2019-11-15 LAB — CBC WITH DIFFERENTIAL/PLATELET
Abs Immature Granulocytes: 0.04 10*3/uL (ref 0.00–0.07)
Basophils Absolute: 0.1 10*3/uL (ref 0.0–0.1)
Basophils Relative: 1 %
Eosinophils Absolute: 0.5 10*3/uL (ref 0.0–0.5)
Eosinophils Relative: 4 %
HCT: 44.8 % (ref 36.0–46.0)
Hemoglobin: 13.9 g/dL (ref 12.0–15.0)
Immature Granulocytes: 0 %
Lymphocytes Relative: 9 %
Lymphs Abs: 1 10*3/uL (ref 0.7–4.0)
MCH: 29.6 pg (ref 26.0–34.0)
MCHC: 31 g/dL (ref 30.0–36.0)
MCV: 95.3 fL (ref 80.0–100.0)
Monocytes Absolute: 1 10*3/uL (ref 0.1–1.0)
Monocytes Relative: 9 %
Neutro Abs: 8.5 10*3/uL — ABNORMAL HIGH (ref 1.7–7.7)
Neutrophils Relative %: 77 %
Platelets: 252 10*3/uL (ref 150–400)
RBC: 4.7 MIL/uL (ref 3.87–5.11)
RDW: 12.3 % (ref 11.5–15.5)
WBC: 11 10*3/uL — ABNORMAL HIGH (ref 4.0–10.5)
nRBC: 0 % (ref 0.0–0.2)

## 2019-11-15 LAB — COMPREHENSIVE METABOLIC PANEL
ALT: 12 U/L (ref 0–44)
AST: 19 U/L (ref 15–41)
Albumin: 3.3 g/dL — ABNORMAL LOW (ref 3.5–5.0)
Alkaline Phosphatase: 40 U/L (ref 38–126)
Anion gap: 19 — ABNORMAL HIGH (ref 5–15)
BUN: 47 mg/dL — ABNORMAL HIGH (ref 8–23)
CO2: 19 mmol/L — ABNORMAL LOW (ref 22–32)
Calcium: 9.3 mg/dL (ref 8.9–10.3)
Chloride: 100 mmol/L (ref 98–111)
Creatinine, Ser: 1.4 mg/dL — ABNORMAL HIGH (ref 0.44–1.00)
GFR calc Af Amer: 41 mL/min — ABNORMAL LOW (ref >60)
GFR calc non Af Amer: 35 mL/min — ABNORMAL LOW (ref >60)
Glucose, Bld: 118 mg/dL — ABNORMAL HIGH (ref 70–99)
Potassium: 5.1 mmol/L (ref 3.5–5.1)
Sodium: 138 mmol/L (ref 135–145)
Total Bilirubin: 1.3 mg/dL — ABNORMAL HIGH (ref 0.3–1.2)
Total Protein: 7.9 g/dL (ref 6.5–8.1)

## 2019-11-15 LAB — CLOSTRIDIUM DIFFICILE BY PCR, REFLEXED: Toxigenic C. Difficile by PCR: POSITIVE — AB

## 2019-11-15 LAB — URINALYSIS, ROUTINE W REFLEX MICROSCOPIC
Bacteria, UA: NONE SEEN
Bilirubin Urine: NEGATIVE
Glucose, UA: NEGATIVE mg/dL
Ketones, ur: 5 mg/dL — AB
Nitrite: NEGATIVE
Protein, ur: NEGATIVE mg/dL
Specific Gravity, Urine: 1.019 (ref 1.005–1.030)
WBC, UA: >50 WBC/hpf — ABNORMAL HIGH (ref 0–5)
pH: 5 (ref 5.0–8.0)

## 2019-11-15 LAB — C DIFFICILE QUICK SCREEN W PCR REFLEX
C Diff antigen: POSITIVE — AB
C Diff toxin: NEGATIVE

## 2019-11-15 MED ORDER — SODIUM CHLORIDE 0.9 % IV BOLUS
500.0000 mL | Freq: Once | INTRAVENOUS | Status: AC
  Administered 2019-11-15: 500 mL via INTRAVENOUS

## 2019-11-15 MED ORDER — SODIUM CHLORIDE 0.9 % IV BOLUS
1000.0000 mL | Freq: Once | INTRAVENOUS | Status: AC
  Administered 2019-11-15: 1000 mL via INTRAVENOUS

## 2019-11-15 MED ORDER — VANCOMYCIN 50 MG/ML ORAL SOLUTION
125.0000 mg | Freq: Four times a day (QID) | ORAL | Status: DC
  Administered 2019-11-15 – 2019-11-18 (×8): 125 mg via ORAL
  Filled 2019-11-15 (×14): qty 2.5

## 2019-11-15 MED ORDER — SODIUM CHLORIDE 0.9 % IV SOLN
1.0000 g | INTRAVENOUS | Status: DC
  Administered 2019-11-15 – 2019-11-16 (×2): 1 g via INTRAVENOUS
  Filled 2019-11-15 (×2): qty 10

## 2019-11-15 NOTE — ED Notes (Signed)
Pt IV infiltrated to RT arm, swelling observed.  Angiocath removed, arm placed on pillow and warm compress applied.  A/C and admitting dr notified.

## 2019-11-15 NOTE — H&P (Addendum)
Patient Demographics:    [name] Long, is a 78 y.o. female  MRN: [id number]   DOB - [birth year]  Admit Date - 11/15/2019  Outpatient Primary MD for the patient is Kari Baars, MD   Assessment & Plan:    Principal Problem:   C. difficile colitis Active Problems:   UTI (urinary tract infection)   AKI (acute kidney injury) (HCC)   COVID-19 virus infection    1)C. difficile colitis--- patient recently treated with amoxicillin for E. coli UTI--- patient with profuse, frequent diarrhea, -C. difficile antigen positive, C. difficile toxin negative, however C. difficile PCR positive for toxingenic C. Difficile--- -- given symptoms and recent hospitalization on his antibiotic use will treat empirically with oral vancomycin protocol -We will consider treating for up to 10 days after completing antibiotics for presumed UTI -WBC 11  2)Presumed UTI--- recent E. coli infection from 10/30/2019, - IV Rocephin pending repeat urine culture, plan to treat for,a very short course once culture data available will narrow antibiotics  3)AKI----acute kidney injury on CKD stage -due to dehydration in the setting of profuse diarrhea from C. difficile infection compounded by UTI - creatinine on admission= 1.40  ,   baseline creatinine = 0.73 (11/03/19) ,renally adjust medications, avoid nephrotoxic agents / dehydration/hypotension  --IV Fluids as ordered  4) anion gap metabolic acidosis----due to dehydration as noted above #3---- hydrate IV and orally and monitor BMP  5)s/p  left knee surgery/ORIF left patella for displaced transverse fracture of patella on 10/25/2019--stable, continue brace  6)H/o PE/PAD--continue Xarelto  7) recent COVID-19 infection--- patient tested positive for COVID-19 on 11/02/2019, largely asymptomatic at  this time, may discontinue droplet precautions after 21 days from 11/02/2019  With History of - Reviewed by me  Past Medical History:  Diagnosis Date  . Anemia   . Arthritis   . Biliary acute pancreatitis 2006   s/p ERCP with stent, sphincterotomy, stone extraction, subsequent stent removal  several weeks later  . Hypertension   . Kidney anomaly, congenital    solitary left kidney  . Myocardial infarction (HCC)   . PE (pulmonary embolism)   . Peripheral vascular disease (HCC)    blood clots in legs  . Schizophrenia (HCC)    pt denies      Past Surgical History:  Procedure Laterality Date  . ABDOMINAL HYSTERECTOMY    . CHOLECYSTECTOMY    . COLONOSCOPY    . KNEE SURGERY Left   . ORIF PATELLA Left 10/25/2019   Procedure: OPEN REDUCTION INTERNAL (ORIF) FIXATION LEFT PATELLA;  Surgeon: Yolonda Kida, MD;  Location: [facility] OR;  Service: Orthopedics;  Laterality: Left;   . UMBILICAL HERNIA REPAIR      Chief Complaint  Patient presents with  . Dehydration      HPI:    [name] Long  is a 78 y.o. female  with history of biliary pancreatitis, HTN, solitary left kidney, pulmonary embolism, PAD, s/p  left knee  surgery/ORIF left patella for displaced transverse fracture of patella on 10/25/2019 and recent COVID-19 infection returns to the ED from [facility] in [place] today for dehydration, diarrhea, generalized weakness for the last few days--stools were watery at times mucousy No fever  Or chills  -Complains of abdominal discomfort denies significant abdominal pain -Has nausea and was dry heaving  in ED--C. difficile studies noted, WBCs 11 ---Creatinine up to 1.4, T bili 1.3, anion gap 19, bicarb 19, potassium 5.1 --UA suggestive of UTI   Review of systems:    In addition to the HPI above,   A full Review of  Systems was done, all other systems reviewed are negative except as noted above in HPI , .    Social History:  Reviewed by me    Social History    Tobacco Use  . Smoking status: Never Smoker  . Smokeless tobacco: Never Used  Substance Use Topics  . Alcohol use: Not Currently    Comment: heavy drinker in the past     Family History :  Reviewed by me    Family History  Problem Relation Age of Onset  . Colon cancer Other        Mother, Brother, unsure age     Home Medications:   Prior to Admission medications   Medication Sig Start Date End Date Taking? Authorizing Provider  HYDROcodone-acetaminophen (NORCO) 5-325 MG tablet Take 1 tablet by mouth every 4 (four) hours as needed for moderate pain. 10/27/19  Yes Yolonda Kidaogers, Jason Patrick, MD  mirtazapine (REMERON) 7.5 MG tablet Take 7.5 mg by mouth at bedtime.   Yes [provider]  ondansetron (ZOFRAN ODT) 4 MG disintegrating tablet Take 1 tablet (4 mg total) by mouth every 8 (eight) hours as needed. 10/27/19  Yes Yolonda Kidaogers, Jason Patrick, MD  XARELTO 20 MG TABS tablet Take 20 mg by mouth daily. 10/10/19  Yes [provider]  potassium chloride 20 MEQ TBCR Take 20 mEq by mouth daily. Patient not taking: Reported on 11/15/2019 11/02/19   Kari BaarsHawkins, Edward, MD     Allergies:    No Known Allergies   Physical Exam:   Vitals  Blood pressure 112/75, pulse 92, temperature 97.8 F (36.6 C), temperature source Oral, resp. rate 14, height 5\' 2"  (1.575 m), weight 50 kg, SpO2 97 %.  Physical Examination: General appearance - alert, well appearing, and in no distress  Mental status - alert, oriented to person, place, and time, Eyes - sclera anicteric Neck - supple, no JVD elevation , Chest - clear  to auscultation bilaterally, symmetrical air movement,  Heart - S1 and S2 normal, regular  Abdomen - soft, nontender, nondistended, no masses or organomegaly Neurological - screening mental status exam normal, neck supple without rigidity, cranial nerves II through XII intact, DTR's normal and symmetric Extremities - no pedal edema noted, intact peripheral pulses  Skin -  warm, dry MSK-lower extremity brace in place     Data Review:    CBC Recent Labs  Lab 11/15/19 1226  WBC 11.0*  HGB 13.9  HCT 44.8  PLT 252  MCV 95.3  MCH 29.6  MCHC 31.0  RDW 12.3  LYMPHSABS 1.0  MONOABS 1.0  EOSABS 0.5  BASOSABS 0.1   ------------------------------------------------------------------------------------------------------------------  Chemistries  Recent Labs  Lab 11/15/19 1226  NA 138  K 5.1  CL 100  CO2 19*  GLUCOSE 118*  BUN 47*  CREATININE 1.40*  CALCIUM 9.3  AST 19  ALT 12  ALKPHOS 40  BILITOT 1.3*   ------------------------------------------------------------------------------------------------------------------ estimated creatinine clearance is 25.3 mL/min (A) (by C-G formula based on SCr of 1.4 mg/dL (H)). ------------------------------------------------------------------------------------------------------------------ No results for input(s): TSH, T4TOTAL, T3FREE, THYROIDAB in the last 72 hours.  Invalid input(s): FREET3   Coagulation profile No results for input(s): INR, PROTIME in the last 168 hours. ------------------------------------------------------------------------------------------------------------------- No results for input(s): DDIMER in the last 72 hours. -------------------------------------------------------------------------------------------------------------------  Cardiac Enzymes No results for input(s): CKMB, TROPONINI, MYOGLOBIN in the last 168 hours.  Invalid input(s): CK ------------------------------------------------------------------------------------------------------------------ No results found for: BNP   ---------------------------------------------------------------------------------------------------------------  Urinalysis    Component Value Date/Time   COLORURINE AMBER (A) 11/15/2019 1000   APPEARANCEUR HAZY (A) 11/15/2019 1000   LABSPEC 1.019 11/15/2019 1000   PHURINE 5.0 11/15/2019  1000   GLUCOSEU NEGATIVE 11/15/2019 1000   HGBUR MODERATE (A) 11/15/2019 1000   BILIRUBINUR NEGATIVE 11/15/2019 1000   KETONESUR 5 (A) 11/15/2019 1000   PROTEINUR NEGATIVE 11/15/2019 1000   UROBILINOGEN 4.0 (H) 03/13/2014 1538   NITRITE NEGATIVE 11/15/2019 1000   LEUKOCYTESUR MODERATE (A) 11/15/2019 1000   ----------------------------------------------------------------------------------------------------------------   Imaging Results:    No results found.  Radiological Exams on Admission: No results found.  DVT Prophylaxis -SCD  /xarelto AM Labs Ordered, also please review Full Orders  Family Communication: Admission, patients condition and plan of care including tests being ordered have been discussed with the patient  who indicate understanding and agree with the plan   Code Status - Full Code  Likely DC to  TBD  Condition   stable  Roxan Hockey M.D on 11/15/2019 at 5:19 PM Go to [email or web address] -  for contact info  [facility] - Office  [phone]

## 2019-11-15 NOTE — ED Provider Notes (Signed)
[facility] EMERGENCY DEPARTMENT Provider Note   CSN: [id number] Arrival date & time: 11/15/19  1660     History Chief Complaint  Patient presents with  . Dehydration    [name] Long is a 78 y.o. female.  She was recently admitted here 2 weeks ago for altered mental status and found to have a UTI treated with antibiotics.  She also ended up testing positive for Covid.  She was transferred to [facility] for rehabilitation.  She said for the last 2 or 3 days she has had diarrhea along with poor p.o. intake and generalized weakness.  No abdominal pain no chest pain.  Denies fever.  The history is provided by the patient and the EMS personnel.  Diarrhea Quality:  Malodorous Severity:  Severe Onset quality:  Gradual Number of episodes:  Multiple Timing:  Intermittent Progression:  Unchanged Relieved by:  Nothing Worsened by:  Nothing Ineffective treatments:  None tried Associated symptoms: no abdominal pain, no chills, no recent cough, no fever, no headaches and no vomiting   Risk factors: recent antibiotic use        Past Medical History:  Diagnosis Date  . Anemia   . Arthritis   . Biliary acute pancreatitis 2006   s/p ERCP with stent, sphincterotomy, stone extraction, subsequent stent removal  several weeks later  . Hypertension   . Kidney anomaly, congenital    solitary left kidney  . Myocardial infarction ([place])   . PE (pulmonary embolism)   . Peripheral vascular disease ([place])    blood clots in legs  . Schizophrenia ([place])    pt denies    Patient Active Problem List   Diagnosis Date Noted  . AMS (altered mental status) 10/30/2019  . UTI (urinary tract infection) 10/30/2019  . Displaced transverse fracture of left patella, initial encounter for closed fracture 10/25/2019  . Left patella fracture 10/25/2019  . Screening for colon cancer 10/02/2011    Past Surgical History:  Procedure Laterality Date  . ABDOMINAL HYSTERECTOMY    . CHOLECYSTECTOMY    .  COLONOSCOPY    . KNEE SURGERY Left   . ORIF PATELLA Left 10/25/2019   Procedure: OPEN REDUCTION INTERNAL (ORIF) FIXATION LEFT PATELLA;  Surgeon: Nicholes Stairs, MD;  Location: [facility];  Service: Orthopedics;  Laterality: Left;  160mins  . UMBILICAL HERNIA REPAIR       OB History    Gravida      Para      Term      Preterm      AB      Living  1     SAB      TAB      Ectopic      Multiple      Live Births              Family History  Problem Relation Age of Onset  . Colon cancer Other        Mother, Brother, unsure age    Social History   Tobacco Use  . Smoking status: Never Smoker  . Smokeless tobacco: Never Used  Substance Use Topics  . Alcohol use: Not Currently    Comment: heavy drinker in the past  . Drug use: No    Home Medications Prior to Admission medications   Medication Sig Start Date End Date Taking? Authorizing Provider  HYDROcodone-acetaminophen (NORCO) 5-325 MG tablet Take 1 tablet by mouth every 4 (four) hours as needed for moderate pain. 10/27/19  Yolonda Kidaogers, Jason Patrick, MD  metoprolol tartrate (LOPRESSOR) 25 MG tablet Take 12.5 mg by mouth 2 (two) times daily.     [provider]  ondansetron (ZOFRAN ODT) 4 MG disintegrating tablet Take 1 tablet (4 mg total) by mouth every 8 (eight) hours as needed. 10/27/19   Yolonda Kidaogers, Jason Patrick, MD  potassium chloride 20 MEQ TBCR Take 20 mEq by mouth daily. 11/02/19   Kari BaarsHawkins, Edward, MD  XARELTO 20 MG TABS tablet Take 20 mg by mouth daily. 10/10/19   [provider]    Allergies    Patient has no known allergies.  Review of Systems   Review of Systems  Constitutional: Positive for fatigue. Negative for chills and fever.  HENT: Negative for sore throat.   Eyes: Negative for visual disturbance.  Respiratory: Negative for shortness of breath.   Cardiovascular: Negative for chest pain.  Gastrointestinal: Positive for diarrhea and nausea. Negative for abdominal pain and  vomiting.  Genitourinary: Negative for dysuria.  Musculoskeletal: Negative for back pain.  Skin: Negative for rash.  Neurological: Negative for headaches.    Physical Exam Updated Vital Signs BP (!) 94/59   Temp 97.8 F (36.6 C) (Oral)   Resp (!) 24   Ht 5\' 2"  (1.575 m)   Wt 50 kg   SpO2 100%   BMI 20.16 kg/m   Physical Exam Vitals and nursing note reviewed.  Constitutional:      General: She is not in acute distress.    Appearance: She is well-developed.  HENT:     Head: Normocephalic and atraumatic.  Eyes:     Conjunctiva/sclera: Conjunctivae normal.  Cardiovascular:     Rate and Rhythm: Normal rate and regular rhythm.     Heart sounds: No murmur.  Pulmonary:     Effort: Pulmonary effort is normal. No respiratory distress.     Breath sounds: Normal breath sounds.  Abdominal:     Palpations: Abdomen is soft.     Tenderness: There is no abdominal tenderness. There is no guarding.  Musculoskeletal:        General: Normal range of motion.     Cervical back: Neck supple.     Right lower leg: No edema.     Left lower leg: No edema.  Skin:    General: Skin is warm and dry.     Capillary Refill: Capillary refill takes less than 2 seconds.  Neurological:     General: No focal deficit present.     Mental Status: She is alert.     ED Results / Procedures / Treatments   Labs (all labs ordered are listed, but only abnormal results are displayed) Labs Reviewed  C DIFFICILE QUICK SCREEN W PCR REFLEX - Abnormal; Notable for the following components:      Result Value   C Diff antigen POSITIVE (*)    All other components within normal limits  CLOSTRIDIUM DIFFICILE BY PCR, REFLEXED - Abnormal; Notable for the following components:   Toxigenic C. Difficile by PCR POSITIVE (*)    All other components within normal limits  URINALYSIS, ROUTINE W REFLEX MICROSCOPIC - Abnormal; Notable for the following components:   Color, Urine AMBER (*)    APPearance HAZY (*)    Hgb  urine dipstick MODERATE (*)    Ketones, ur 5 (*)    Leukocytes,Ua MODERATE (*)    WBC, UA >50 (*)    Non Squamous Epithelial 0-5 (*)    All other components within normal limits  CBC  WITH DIFFERENTIAL/PLATELET - Abnormal; Notable for the following components:   WBC 11.0 (*)    Neutro Abs 8.5 (*)    All other components within normal limits  COMPREHENSIVE METABOLIC PANEL - Abnormal; Notable for the following components:   CO2 19 (*)    Glucose, Bld 118 (*)    BUN 47 (*)    Creatinine, Ser 1.40 (*)    Albumin 3.3 (*)    Total Bilirubin 1.3 (*)    GFR calc non Af Amer 35 (*)    GFR calc Af Amer 41 (*)    Anion gap 19 (*)    All other components within normal limits  GI PATHOGEN PANEL BY PCR, STOOL  URINE CULTURE  C DIFFICILE QUICK SCREEN W PCR REFLEX    EKG EKG Interpretation  Date/Time:  Tuesday November 15 2019 09:57:09 EST Ventricular Rate:  104 PR Interval:    QRS Duration: 89 QT Interval:  317 QTC Calculation: 401 R Axis:   99 Text Interpretation: Sinus tachycardia Multiform ventricular premature complexes Right axis deviation Borderline repolarization abnormality No significant change since 12/20 Confirmed by Meridee Score [phone]) on 11/15/2019 10:00:06 AM   Radiology No results found.  Procedures Procedures (including critical care time)  Medications Ordered in ED Medications  vancomycin (VANCOCIN) 50 mg/mL oral solution 125 mg (has no administration in time range)  cefTRIAXone (ROCEPHIN) 1 g in sodium chloride 0.9 % 100 mL IVPB (0 g Intravenous Stopped 11/15/19 1830)  sodium chloride 0.9 % bolus 500 mL (0 mLs Intravenous Stopped 11/15/19 1438)  sodium chloride 0.9 % bolus 1,000 mL (0 mLs Intravenous Stopped 11/15/19 1831)    ED Course  I have reviewed the triage vital signs and the nursing notes.  Pertinent labs & imaging results that were available during my care of the patient were reviewed by me and considered in my medical decision making (see  chart for details).  Clinical Course as of Nov 14 1856  Tue Nov 15, 2019  [id number] 78 year old female here with diarrhea and weakness after a recent admission and antibiotics.  Differential includes infectious diarrhea, Covid, C. difficile, UTI, metabolic disturbance.   [MB]  1011 On review of prior admission she grew out 80,000 E. coli.  In and out catheter here shows greater than 50 whites nitrite negative.   [MB]    Clinical Course User Index [MB] Terrilee Files, MD   MDM Rules/Calculators/A&P                     BRANDICE BUSSER was evaluated in Emergency Department on 11/15/2019 for the symptoms described in the history of present illness. She was evaluated in the context of the global COVID-19 pandemic, which necessitated consideration that the patient might be at risk for infection with the SARS-CoV-2 virus that causes COVID-19. Institutional protocols and algorithms that pertain to the evaluation of patients at risk for COVID-19 are in a state of rapid change based on information released by regulatory bodies including the CDC and federal and state organizations. These policies and algorithms were followed during the patient's care in the ED.  Patient with AKI, weakness. Will need admission. Cdif is positive. Discussed with [facility] Dr Mariea Clonts who accepts for admission.  Final Clinical Impression(s) / ED Diagnoses Final diagnoses:  Diarrhea of presumed infectious origin  AKI (acute kidney injury) (HCC)  COVID-19 virus infection  Lower urinary tract infection    Rx / DC Orders ED Discharge Orders    None  Terrilee Files, MD 11/15/19 [phone]

## 2019-11-15 NOTE — ED Triage Notes (Signed)
[facility] called out to [facility] in [place] today for dehydration, diarrhea, generalized weakness, and low heart rate this morning. PT diagnosed with covid around 2 weeks ago. PT coughing and dry heaving on arrival. Diarrhea noted in incontinence brief upon arrival and obtained for stool sample.

## 2019-11-16 ENCOUNTER — Encounter (HOSPITAL_COMMUNITY): Payer: Self-pay | Admitting: Family Medicine

## 2019-11-16 DIAGNOSIS — N179 Acute kidney failure, unspecified: Secondary | ICD-10-CM

## 2019-11-16 DIAGNOSIS — N390 Urinary tract infection, site not specified: Secondary | ICD-10-CM

## 2019-11-16 DIAGNOSIS — U071 COVID-19: Secondary | ICD-10-CM

## 2019-11-16 LAB — BASIC METABOLIC PANEL
Anion gap: 16 — ABNORMAL HIGH (ref 5–15)
BUN: 36 mg/dL — ABNORMAL HIGH (ref 8–23)
CO2: 18 mmol/L — ABNORMAL LOW (ref 22–32)
Calcium: 8.9 mg/dL (ref 8.9–10.3)
Chloride: 108 mmol/L (ref 98–111)
Creatinine, Ser: 0.9 mg/dL (ref 0.44–1.00)
GFR calc Af Amer: >60 mL/min (ref >60)
GFR calc non Af Amer: >60 mL/min (ref >60)
Glucose, Bld: 93 mg/dL (ref 70–99)
Potassium: 4.6 mmol/L (ref 3.5–5.1)
Sodium: 142 mmol/L (ref 135–145)

## 2019-11-16 LAB — CBC
HCT: 41.5 % (ref 36.0–46.0)
Hemoglobin: 12.5 g/dL (ref 12.0–15.0)
MCH: 29.6 pg (ref 26.0–34.0)
MCHC: 30.1 g/dL (ref 30.0–36.0)
MCV: 98.1 fL (ref 80.0–100.0)
Platelets: 215 10*3/uL (ref 150–400)
RBC: 4.23 MIL/uL (ref 3.87–5.11)
RDW: 12.5 % (ref 11.5–15.5)
WBC: 9.8 10*3/uL (ref 4.0–10.5)
nRBC: 0 % (ref 0.0–0.2)

## 2019-11-16 LAB — GLUCOSE, CAPILLARY: Glucose-Capillary: 93 mg/dL (ref 70–99)

## 2019-11-16 MED ORDER — ACETAMINOPHEN 650 MG RE SUPP: 650.0000 mg | Freq: Four times a day (QID) | RECTAL | Status: DC | PRN

## 2019-11-16 MED ORDER — POLYETHYLENE GLYCOL 3350 17 G PO PACK: 17.0000 g | PACK | Freq: Every day | ORAL | Status: DC | PRN

## 2019-11-16 MED ORDER — SODIUM CHLORIDE 0.9 % IV SOLN: INTRAVENOUS | Status: DC

## 2019-11-16 MED ORDER — SODIUM CHLORIDE 0.9 % IV SOLN: 250.0000 mL | INTRAVENOUS | Status: DC | PRN

## 2019-11-16 MED ORDER — ALBUTEROL SULFATE (2.5 MG/3ML) 0.083% IN NEBU: 2.5000 mg | INHALATION_SOLUTION | RESPIRATORY_TRACT | Status: DC | PRN

## 2019-11-16 MED ORDER — SODIUM CHLORIDE 0.9% FLUSH: 3.0000 mL | INTRAVENOUS | Status: DC | PRN

## 2019-11-16 MED ORDER — HYDROCODONE-ACETAMINOPHEN 5-325 MG PO TABS
1.0000 | ORAL_TABLET | ORAL | Status: DC | PRN
  Administered 2019-11-16: 11:00:00 1 via ORAL
  Filled 2019-11-16: qty 1

## 2019-11-16 MED ORDER — SODIUM CHLORIDE 0.9% FLUSH
3.0000 mL | Freq: Two times a day (BID) | INTRAVENOUS | Status: DC
  Administered 2019-11-16 – 2019-11-17 (×3): 3 mL via INTRAVENOUS

## 2019-11-16 MED ORDER — ACETAMINOPHEN 325 MG PO TABS: 650.0000 mg | ORAL_TABLET | Freq: Four times a day (QID) | ORAL | Status: DC | PRN

## 2019-11-16 MED ORDER — TRAZODONE HCL 50 MG PO TABS: 50.0000 mg | ORAL_TABLET | Freq: Every evening | ORAL | Status: DC | PRN

## 2019-11-16 MED ORDER — RIVAROXABAN 20 MG PO TABS
20.0000 mg | ORAL_TABLET | Freq: Every day | ORAL | Status: DC
  Administered 2019-11-16: 20 mg via ORAL
  Filled 2019-11-16: qty 1

## 2019-11-16 MED ORDER — MIRTAZAPINE 15 MG PO TABS
7.5000 mg | ORAL_TABLET | Freq: Every day | ORAL | Status: DC
  Administered 2019-11-16 – 2019-11-18 (×2): 7.5 mg via ORAL
  Filled 2019-11-16 (×2): qty 1

## 2019-11-16 MED ORDER — ONDANSETRON HCL 4 MG PO TABS: 4.0000 mg | ORAL_TABLET | Freq: Four times a day (QID) | ORAL | Status: DC | PRN

## 2019-11-16 MED ORDER — ONDANSETRON HCL 4 MG/2ML IJ SOLN: 4.0000 mg | Freq: Four times a day (QID) | INTRAMUSCULAR | Status: DC | PRN

## 2019-11-16 NOTE — Plan of Care (Signed)
Pt with very poor appetite. Only drinking water at this time, refusing meals. MD notified.  Problem: Education: Goal: Knowledge of risk factors and measures for prevention of condition will improve Outcome: Progressing   Problem: Coping: Goal: Psychosocial and spiritual needs will be supported Outcome: Progressing   Problem: Respiratory: Goal: Will maintain a patent airway Outcome: Progressing Goal: Complications related to the disease process, condition or treatment will be avoided or minimized Outcome: Progressing   Problem: Education: Goal: Knowledge of General Education information will improve Description: Including pain rating scale, medication(s)/side effects and non-pharmacologic comfort measures Outcome: Progressing   Problem: Health Behavior/Discharge Planning: Goal: Ability to manage health-related needs will improve Outcome: Progressing   Problem: Clinical Measurements: Goal: Ability to maintain clinical measurements within normal limits will improve Outcome: Progressing Goal: Will remain free from infection Outcome: Progressing Goal: Diagnostic test results will improve Outcome: Progressing Goal: Respiratory complications will improve Outcome: Progressing Goal: Cardiovascular complication will be avoided Outcome: Progressing   Problem: Activity: Goal: Risk for activity intolerance will decrease Outcome: Progressing   Problem: Coping: Goal: Level of anxiety will decrease Outcome: Progressing   Problem: Elimination: Goal: Will not experience complications related to bowel motility Outcome: Progressing Goal: Will not experience complications related to urinary retention Outcome: Progressing   Problem: Pain Managment: Goal: General experience of comfort will improve Outcome: Progressing   Problem: Safety: Goal: Ability to remain free from injury will improve Outcome: Progressing   Problem: Skin Integrity: Goal: Risk for impaired skin integrity will  decrease Outcome: Progressing

## 2019-11-16 NOTE — Plan of Care (Signed)

## 2019-11-16 NOTE — Progress Notes (Signed)
PROGRESS NOTE  [name] Long TKZ:[id number] DOB: [birth year] DOA: 11/15/2019 PCP: Sinda Du, MD  Brief History:  78 y.o. female with history of biliary pancreatitis, HTN, solitary left kidney, pulmonary embolism, PAD, s/p  left knee surgery/ORIF left patella for displaced transverse fracture of patella on 10/25/2019 and recent COVID-19 infection returns to the ED from [facility] in [place] for dehydration, diarrhea, generalized weakness for the last few days--stools were watery at times mucousy She was recently hospitalized 10/29/19-11/03/19 for AMS due to UTI.  She was discharged to SNF with amoxicillin.  She was also noted to have a positive COVID test on 12/16, but remained asymptomatic -Complains of abdominal discomfort denies significant abdominal pain -Has nausea and was dry heaving  in ED--C. difficile studies positive, WBCs 11 ---Creatinine up to 1.4, T bili 1.3, anion gap 19, bicarb 19, potassium 5.1 --UA suggestive of UTI --pt started on po vancomycin and IV ceftriaxone  Assessment/Plan: C. difficile colitis -patient recently treated with amoxicillin for E. coli UTI--- patient with profuse, frequent diarrhea, -C. difficile antigen positive, C. difficile toxin negative, however C. difficile PCR positive for toxingenic C. Difficile--- -- given symptoms and recent hospitalization on his antibiotic use will treat empirically with oral vancomycin protocol  Presumed UTI-- - recent E. coli infection from 10/30/2019, - IV Rocephin pending repeat urine culture -plan short course  AKI -due to volume depletion -baseline creatinine 0.6-0.8 - creatinine on admission= 1.40  (11/03/19) --IV Fluids as ordered  anion gap metabolic acidosis -due to dehydration and AKI as noted above #3---- hydrate IV and orally and monitor BMP  s/p  left knee surgery/ORIF left patella for displaced transverse fracture of patella on 10/25/2019--stable, continue brace  History of  PE/PAD --continue Xarelto  recent COVID-19 infection -- patient tested positive for COVID-19 on 11/02/2019, largely asymptomatic at this time, may discontinue droplet precautions after 14 days from 11/02/2019 if remains asymptomatic       Disposition Plan:   SNF 1-2 days  Family Communication:  No Family at bedside  Consultants:  none  Code Status:  FULL   DVT Prophylaxis:  Xarelto   Procedures: As Listed in Progress Note Above  Antibiotics: Vancomycin po 11/15/19>>> Ceftriaxone 11/15/19>>>     Subjective: Patient denies fevers, chills, headache, chest pain, dyspnea, nausea, vomiting, diarrhea, abdominal pain, dysuria, hematuria   Objective: Vitals:   11/16/19 0100 11/16/19 0253 11/16/19 0507 11/16/19 1325  BP: 112/62 100/70 110/67 98/63  Pulse: 87 77 78 75  Resp: 16 20 16 17   Temp:  98.4 F (36.9 C) 98.2 F (36.8 C) 98.3 F (36.8 C)  TempSrc:  Oral Oral Oral  SpO2: 99% 100% 99%   Weight:      Height:        Intake/Output Summary (Last 24 hours) at 11/16/2019 1744 Last data filed at 11/16/2019 1300 Gross per 24 hour  Intake 448.03 ml  Output --  Net 448.03 ml   Weight change:  Exam:   General:  Pt is alert, follows commands appropriately, not in acute distress  HEENT: No icterus, No thrush, No neck mass, [place]/AT  Cardiovascular: RRR, S1/S2, no rubs, no gallops  Respiratory: bibasilar rales. No wheeze  Abdomen: Soft/+BS, non tender, non distended, no guarding  Extremities: No edema, No lymphangitis, No petechiae, No rashes, no synovitis   Data Reviewed: I have personally reviewed following labs and imaging studies Basic Metabolic Panel: Recent Labs  Lab 11/15/19 1226 11/16/19 0644  NA 138 142  K 5.1 4.6  CL 100 108  CO2 19* 18*  GLUCOSE 118* 93  BUN 47* 36*  CREATININE 1.40* 0.90  CALCIUM 9.3 8.9   Liver Function Tests: Recent Labs  Lab 11/15/19 1226  AST 19  ALT 12  ALKPHOS 40  BILITOT 1.3*  PROT 7.9  ALBUMIN 3.3*    No results for input(s): LIPASE, AMYLASE in the last 168 hours. No results for input(s): AMMONIA in the last 168 hours. Coagulation Profile: No results for input(s): INR, PROTIME in the last 168 hours. CBC: Recent Labs  Lab 11/15/19 1226 11/16/19 0644  WBC 11.0* 9.8  NEUTROABS 8.5*  --   HGB 13.9 12.5  HCT 44.8 41.5  MCV 95.3 98.1  PLT 252 215   Cardiac Enzymes: No results for input(s): CKTOTAL, CKMB, CKMBINDEX, TROPONINI in the last 168 hours. BNP: Invalid input(s): POCBNP CBG: No results for input(s): GLUCAP in the last 168 hours. HbA1C: No results for input(s): HGBA1C in the last 72 hours. Urine analysis:    Component Value Date/Time   COLORURINE AMBER (A) 11/15/2019 1000   APPEARANCEUR HAZY (A) 11/15/2019 1000   LABSPEC 1.019 11/15/2019 1000   PHURINE 5.0 11/15/2019 1000   GLUCOSEU NEGATIVE 11/15/2019 1000   HGBUR MODERATE (A) 11/15/2019 1000   BILIRUBINUR NEGATIVE 11/15/2019 1000   KETONESUR 5 (A) 11/15/2019 1000   PROTEINUR NEGATIVE 11/15/2019 1000   UROBILINOGEN 4.0 (H) 03/13/2014 1538   NITRITE NEGATIVE 11/15/2019 1000   LEUKOCYTESUR MODERATE (A) 11/15/2019 1000   Sepsis Labs: @LABRCNTIP (procalcitonin:4,lacticidven:4) ) Recent Results (from the past 240 hour(s))  C Difficile Quick Screen w PCR reflex     Status: Abnormal   Collection Time: 11/15/19 10:40 AM   Specimen: STOOL  Result Value Ref Range Status   C Diff antigen POSITIVE (A) NEGATIVE Final    Comment: RESULT CALLED TO, READ BACK BY AND VERIFIED WITH: MINTER,R@1352  BY MATTHEWS, B 12.29.2020    C Diff toxin NEGATIVE NEGATIVE Final   C Diff interpretation Results are indeterminate. See PCR results.  Final    Comment: Performed at [facility], [address] [place], [place]  C. Diff by PCR, Reflexed     Status: Abnormal   Collection Time: 11/15/19 10:40 AM  Result Value Ref Range Status   Toxigenic C. Difficile by PCR POSITIVE (A) NEGATIVE Final    Comment: Positive for  toxigenic C. difficile with little to no toxin production. Only treat if clinical presentation suggests symptomatic illness. Performed at [facility], [address], [place], [place]      Scheduled Meds: . mirtazapine  7.5 mg Oral QHS  . rivaroxaban  20 mg Oral Q supper  . sodium chloride flush  3 mL Intravenous Q12H  . vancomycin  125 mg Oral QID   Continuous Infusions: . sodium chloride    . sodium chloride 75 mL/hr at 11/16/19 1612  . cefTRIAXone (ROCEPHIN)  IV Stopped (11/15/19 1830)    Procedures/Studies: DG Knee 1-2 Views Left  Result Date: 10/25/2019 CLINICAL DATA:  ORIF left patella Radiation Safety Timeout performed by TMS LMP verified prior to exam, TMS Tech wore surgical mask/unable to visualize ptSurgery elective EXAM: LEFT KNEE - 1-2 VIEW; DG C-ARM 1-60 MIN COMPARISON:  10/13/2019 FINDINGS: Patient has undergone ORIF of the patella with 2 cortical screws. No new fracture. IMPRESSION: ORIF of the patella. Electronically Signed   By: 10/15/2019 M.D.   On: 10/25/2019 15:37   CT Head  Wo Contrast  Result Date: 10/29/2019 CLINICAL DATA:  Confusion EXAM: CT HEAD WITHOUT CONTRAST TECHNIQUE: Contiguous axial images were obtained from the base of the skull through the vertex without intravenous contrast. COMPARISON:  09/26/2016 FINDINGS: Brain: Mild atrophic changes are noted commenced with the patient's given age. No findings to suggest acute hemorrhage or space-occupying mass lesion are noted. Scattered white matter ischemic changes are noted. Vascular: No hyperdense vessel or unexpected calcification. Skull: Normal. Negative for fracture or focal lesion. Sinuses/Orbits: No acute finding. Other: None. IMPRESSION: Mild atrophic and ischemic changes without acute abnormality. Electronically Signed   By: Alcide CleverMark  Lukens M.D.   On: 10/29/2019 23:37   DG Chest Port 1 View  Result Date: 10/29/2019 CLINICAL DATA:  Confusion and shortness of breath EXAM: PORTABLE CHEST  1 VIEW COMPARISON:  12/01/17 FINDINGS: Cardiac shadow is stable. Aortic calcifications are again seen. The lungs are well aerated bilaterally. No focal infiltrate or sizable effusion is seen. Degenerative changes of the shoulder joints are noted bilaterally. IMPRESSION: No acute abnormality noted. Aortic Atherosclerosis (ICD10-I70.0). Electronically Signed   By: Alcide CleverMark  Lukens M.D.   On: 10/29/2019 23:38   DG C-Arm 1-60 Min  Result Date: 10/25/2019 CLINICAL DATA:  ORIF left patella Radiation Safety Timeout performed by TMS LMP verified prior to exam, TMS Tech wore surgical mask/unable to visualize ptSurgery elective EXAM: LEFT KNEE - 1-2 VIEW; DG C-ARM 1-60 MIN COMPARISON:  10/13/2019 FINDINGS: Patient has undergone ORIF of the patella with 2 cortical screws. No new fracture. IMPRESSION: ORIF of the patella. Electronically Signed   By: Norva PavlovElizabeth  Brown M.D.   On: 10/25/2019 15:37    Catarina Hartshornavid Nandini Bogdanski, DO  [facility] Pager [phone]  If 7PM-7AM, please contact night-coverage [email or web address] Password [email or web address] 11/16/2019, 5:44 PM   LOS: 1 day

## 2019-11-16 NOTE — Progress Notes (Signed)
Pt has refused all meals today. Only drinks water. Have offered her all types of foods and snacks but pt declines. States, "I just ain't hungry, I ain't got no appetite." MD aware. Oral care completed x2 this shift due to dry mucous membranes and lips. IVF continues. Has put out approx 350 amber colored urine so far this shift.

## 2019-11-17 LAB — GI PATHOGEN PANEL BY PCR, STOOL

## 2019-11-17 LAB — URINE CULTURE: Culture: <10000 — AB

## 2019-11-17 LAB — CBC
HCT: 34.8 % — ABNORMAL LOW (ref 36.0–46.0)
Hemoglobin: 10.4 g/dL — ABNORMAL LOW (ref 12.0–15.0)
MCH: 29.1 pg (ref 26.0–34.0)
MCHC: 29.9 g/dL — ABNORMAL LOW (ref 30.0–36.0)
MCV: 97.5 fL (ref 80.0–100.0)
Platelets: 189 10*3/uL (ref 150–400)
RBC: 3.57 MIL/uL — ABNORMAL LOW (ref 3.87–5.11)
RDW: 12.5 % (ref 11.5–15.5)
WBC: 7.3 10*3/uL (ref 4.0–10.5)
nRBC: 0 % (ref 0.0–0.2)

## 2019-11-17 LAB — BASIC METABOLIC PANEL
Anion gap: 13 (ref 5–15)
BUN: 14 mg/dL (ref 8–23)
CO2: 19 mmol/L — ABNORMAL LOW (ref 22–32)
Calcium: 8.6 mg/dL — ABNORMAL LOW (ref 8.9–10.3)
Chloride: 108 mmol/L (ref 98–111)
Creatinine, Ser: 0.59 mg/dL (ref 0.44–1.00)
GFR calc Af Amer: >60 mL/min (ref >60)
GFR calc non Af Amer: >60 mL/min (ref >60)
Glucose, Bld: 85 mg/dL (ref 70–99)
Potassium: 3.3 mmol/L — ABNORMAL LOW (ref 3.5–5.1)
Sodium: 140 mmol/L (ref 135–145)

## 2019-11-17 LAB — MAGNESIUM: Magnesium: 1.7 mg/dL (ref 1.7–2.4)

## 2019-11-17 MED ORDER — POTASSIUM CHLORIDE CRYS ER 20 MEQ PO TBCR
40.0000 meq | EXTENDED_RELEASE_TABLET | Freq: Once | ORAL | Status: AC
  Administered 2019-11-17: 15:00:00 40 meq via ORAL
  Filled 2019-11-17: qty 2

## 2019-11-17 MED ORDER — MAGNESIUM SULFATE 2 GM/50ML IV SOLN
2.0000 g | Freq: Once | INTRAVENOUS | Status: AC
  Administered 2019-11-17: 2 g via INTRAVENOUS
  Filled 2019-11-17: qty 50

## 2019-11-17 MED ORDER — VANCOMYCIN 50 MG/ML ORAL SOLUTION: 125.0000 mg | Freq: Four times a day (QID) | ORAL | 0 refills | Status: AC

## 2019-11-17 NOTE — NC FL2 (Signed)
[place] MEDICAID FL2 LEVEL OF CARE SCREENING TOOL     IDENTIFICATION  Patient Name: [name] Long Birthdate: 10-May-1941 Sex: female Admission Date (Current Location): 11/15/2019  [facility] [place] and [place] Number:  [employer] and Address:  [facility],  [address], [place] [address]      Provider Number: [id number]  Attending Physician Name and Address:  Catarina Hartshorn, MD  Relative Name and Phone Number:  [place] Charon  [phone]    Current Level of Care: Hospital Recommended Level of Care: Skilled Nursing Facility Prior Approval Number:    Date Approved/Denied:   PASRR Number: [id number]  Discharge Plan: SNF    Current Diagnoses: Patient Active Problem List   Diagnosis Date Noted  . Diarrhea 11/15/2019  . COVID-19 virus infection 11/15/2019  . C. difficile colitis 11/15/2019  . AKI (acute kidney injury) (HCC) 11/15/2019  . AMS (altered mental status) 10/30/2019  . UTI (urinary tract infection) 10/30/2019  . Displaced transverse fracture of left patella, initial encounter for closed fracture 10/25/2019  . Left patella fracture 10/25/2019  . Screening for colon cancer 10/02/2011    Orientation RESPIRATION BLADDER Height & Weight     Self, Time, Situation, Place  Normal Continent Weight: 110 lb 3.7 oz (50 kg) Height:  5\' 2"  (157.5 cm)  BEHAVIORAL SYMPTOMS/MOOD NEUROLOGICAL BOWEL NUTRITION STATUS      Continent Diet(heart healthy)  AMBULATORY STATUS COMMUNICATION OF NEEDS Skin   Extensive Assist Verbally Normal                       Personal Care Assistance Level of Assistance  Feeding, Dressing, Bathing Bathing Assistance: Limited assistance Feeding assistance: Independent Dressing Assistance: Limited assistance     Functional Limitations Info  Sight, Speech, Hearing Sight Info: Adequate Hearing Info: Adequate Speech Info: Adequate    SPECIAL CARE FACTORS FREQUENCY                       Contractures  Contractures Info: Not present    Additional Factors Info  Code Status, Allergies, Psychotropic, Isolation Precautions Code Status Info: Full Code Allergies Info: NKA Psychotropic Info: Remeron   Isolation Precautions Info: COVID-19 + on 11/02/2019.   C. difficile colitis     Current Medications (11/17/2019):  This is the current hospital active medication list Current Facility-Administered Medications  Medication Dose Route Frequency Provider Last Rate Last Admin  . 0.9 %  sodium chloride infusion  250 mL Intravenous PRN Emokpae, Courage, MD      . 0.9 %  sodium chloride infusion   Intravenous Continuous 11/19/2019, MD 75 mL/hr at 11/17/19 0546 New Bag at 11/17/19 0546  . acetaminophen (TYLENOL) tablet 650 mg  650 mg Oral Q6H PRN Emokpae, Courage, MD       Or  . acetaminophen (TYLENOL) suppository 650 mg  650 mg Rectal Q6H PRN Emokpae, Courage, MD      . albuterol (PROVENTIL) (2.5 MG/3ML) 0.083% nebulizer solution 2.5 mg  2.5 mg Nebulization Q2H PRN Emokpae, Courage, MD      . cefTRIAXone (ROCEPHIN) 1 g in sodium chloride 0.9 % 100 mL IVPB  1 g Intravenous Q24H Emokpae, Courage, MD 200 mL/hr at 11/16/19 1845 1 g at 11/16/19 1845  . HYDROcodone-acetaminophen (NORCO/VICODIN) 5-325 MG per tablet 1 tablet  1 tablet Oral Q4H PRN 11/18/19, MD   1 tablet at 11/16/19 1121  . magnesium sulfate IVPB 2 g 50 mL  2 g  Intravenous Once Tat, Shanon Brow, MD      . mirtazapine (REMERON) tablet 7.5 mg  7.5 mg Oral QHS Emokpae, Courage, MD   7.5 mg at 11/16/19 2118  . ondansetron (ZOFRAN) tablet 4 mg  4 mg Oral Q6H PRN Emokpae, Courage, MD       Or  . ondansetron (ZOFRAN) injection 4 mg  4 mg Intravenous Q6H PRN Emokpae, Courage, MD      . polyethylene glycol (MIRALAX / GLYCOLAX) packet 17 g  17 g Oral Daily PRN Emokpae, Courage, MD      . potassium chloride SA (KLOR-CON) CR tablet 40 mEq  40 mEq Oral Once Tat, David, MD      . rivaroxaban (XARELTO) tablet 20 mg  20 mg Oral Q supper Roxan Hockey, MD   20 mg at 11/16/19 1841  . sodium chloride flush (NS) 0.9 % injection 3 mL  3 mL Intravenous Q12H Emokpae, Courage, MD   3 mL at 11/17/19 1029  . sodium chloride flush (NS) 0.9 % injection 3 mL  3 mL Intravenous PRN Emokpae, Courage, MD      . traZODone (DESYREL) tablet 50 mg  50 mg Oral QHS PRN Emokpae, Courage, MD      . vancomycin (VANCOCIN) 50 mg/mL oral solution 125 mg  125 mg Oral QID Roxan Hockey, MD   125 mg at 11/17/19 1029     Discharge Medications: Please see discharge summary for a list of discharge medications.  Relevant Imaging Results:  Relevant Lab Results:   Additional Information SS#  [id number]  Ihor Gully, LCSW

## 2019-11-17 NOTE — Discharge Summary (Signed)
Physician Discharge Summary  [name] Long MMN:[id number] DOB: 11-25-1940 DOA: 11/15/2019  PCP: Kari Baars, MD  Admit date: 11/15/2019 Discharge date: 11/17/2019  Admitted From: SNF Disposition:  SNF  Recommendations for Outpatient Follow-up:  1. Follow up with PCP in 1-2 weeks 2. Please obtain BMP/CBC in one week     Discharge Condition: Stable CODE STATUS: FULL Diet recommendation: Heart Healthy   Brief/Interim Summary: 77 y.o.femalewith history of biliary pancreatitis,HTN, solitary left kidney, pulmonary embolism,PAD, s/pleft knee surgery/ORIF left patella for displaced transverse fracture of patellaon 12/8/2020and recent COVID-19 infection [facility] [place] [facility], [facility], generalized weaknessfor the last few days--stools were watery at times mucousy She was recently hospitalized 10/29/19-11/03/19 for AMS due to UTI.  She was discharged to SNF with amoxicillin.  She was also noted to have a positive COVID test on 12/16, but remained asymptomatic -Complains of abdominal discomfort denies significant abdominal pain -Has nausea and was dry heaving  in ED--C. difficile studies positive,WBCs 11 ---Creatinine up to 1.4, T bili 1.3, anion gap 19, bicarb 19, potassium 5.1 --UA suggestive of UTI initially --pt started on po vancomycin and IV ceftriaxone -after urine culture was unrevealing, ceftriaxone was discontinued  Discharge Diagnoses:  C. difficile colitis -patient recently treated with amoxicillin for E. coli UTI--- -patient presented with profuse,frequent diarrhea -C. difficile antigen positive, C. difficile toxin negative, however C. difficile PCR positive for toxingenic C. Difficile -po vancomycin started -diarrhea decreased in frequency -on the day of d/c, pt only had 2 BM in 24 hours -finish 8 more days oral vanco to complete 10 day course  Pyuria -recent E. coli infection from 10/30/2019, -IV Rocephin was  started empirically - urine culture showed <10K growth -pt is asymptomatic -d/c abx  AKI -due to volume depletion -baseline creatinine 0.6-0.8 -creatinine on admission= 1.40 (11/03/19) -serum creatinine 0.59 on day of d/c -IVFluids as ordered  anion gap metabolic acidosis -due to dehydration and AKI as noted above #3----hydrate IV and orally and monitor BMP -overall improving  s/pleft knee surgery/ORIF left patella for displaced transverse fracture of patellaon 10/25/2019 --stable, continue brace -continue PT -follow up ortho-Dr. Duwayne Heck  History of PE/PAD --continue Xarelto  recent COVID-19 infection --patient tested positive for COVID-19 on 11/02/2019, largely asymptomatic at this time,may discontinue droplet precautions after 14 days from 11/02/2019 if remains asymptomatic -remains stable on RA without dyspnea   Discharge Instructions   Allergies as of 11/17/2019   No Known Allergies     Medication List    STOP taking these medications   Potassium Chloride ER 20 MEQ Tbcr     TAKE these medications   HYDROcodone-acetaminophen 5-325 MG tablet Commonly known as: Norco Take 1 tablet by mouth every 4 (four) hours as needed for moderate pain.   mirtazapine 7.5 MG tablet Commonly known as: REMERON Take 7.5 mg by mouth at bedtime.   ondansetron 4 MG disintegrating tablet Commonly known as: Zofran ODT Take 1 tablet (4 mg total) by mouth every 8 (eight) hours as needed.   vancomycin 50 mg/mL  oral solution Commonly known as: VANCOCIN Take 2.5 mLs (125 mg total) by mouth 4 (four) times daily. X 8 days   Xarelto 20 MG Tabs tablet Generic drug: rivaroxaban Take 20 mg by mouth daily.       No Known Allergies  Consultations:  none   Procedures/Studies: DG Knee 1-2 Views Left  Result Date: 10/25/2019 CLINICAL DATA:  ORIF left patella Radiation Safety Timeout performed by TMS  LMP verified prior to exam, TMS Tech wore surgical  mask/unable to visualize ptSurgery elective EXAM: LEFT KNEE - 1-2 VIEW; DG C-ARM 1-60 MIN COMPARISON:  10/13/2019 FINDINGS: Patient has undergone ORIF of the patella with 2 cortical screws. No new fracture. IMPRESSION: ORIF of the patella. Electronically Signed   By: Norva PavlovElizabeth  Brown M.D.   On: 10/25/2019 15:37   CT Head Wo Contrast  Result Date: 10/29/2019 CLINICAL DATA:  Confusion EXAM: CT HEAD WITHOUT CONTRAST TECHNIQUE: Contiguous axial images were obtained from the base of the skull through the vertex without intravenous contrast. COMPARISON:  09/26/2016 FINDINGS: Brain: Mild atrophic changes are noted commenced with the patient's given age. No findings to suggest acute hemorrhage or space-occupying mass lesion are noted. Scattered white matter ischemic changes are noted. Vascular: No hyperdense vessel or unexpected calcification. Skull: Normal. Negative for fracture or focal lesion. Sinuses/Orbits: No acute finding. Other: None. IMPRESSION: Mild atrophic and ischemic changes without acute abnormality. Electronically Signed   By: Alcide CleverMark  Lukens M.D.   On: 10/29/2019 23:37   DG Chest Port 1 View  Result Date: 10/29/2019 CLINICAL DATA:  Confusion and shortness of breath EXAM: PORTABLE CHEST 1 VIEW COMPARISON:  12/01/17 FINDINGS: Cardiac shadow is stable. Aortic calcifications are again seen. The lungs are well aerated bilaterally. No focal infiltrate or sizable effusion is seen. Degenerative changes of the shoulder joints are noted bilaterally. IMPRESSION: No acute abnormality noted. Aortic Atherosclerosis (ICD10-I70.0). Electronically Signed   By: Alcide CleverMark  Lukens M.D.   On: 10/29/2019 23:38   DG C-Arm 1-60 Min  Result Date: 10/25/2019 CLINICAL DATA:  ORIF left patella Radiation Safety Timeout performed by TMS LMP verified prior to exam, TMS Tech wore surgical mask/unable to visualize ptSurgery elective EXAM: LEFT KNEE - 1-2 VIEW; DG C-ARM 1-60 MIN COMPARISON:  10/13/2019 FINDINGS: Patient has undergone  ORIF of the patella with 2 cortical screws. No new fracture. IMPRESSION: ORIF of the patella. Electronically Signed   By: Norva PavlovElizabeth  Brown M.D.   On: 10/25/2019 15:37         Discharge Exam: Vitals:   11/16/19 2114 11/17/19 0646  BP: 100/66 (!) 93/52  Pulse: 86 91  Resp: 16   Temp: 98.5 F (36.9 C) 98 F (36.7 C)  SpO2: 97% 98%   Vitals:   11/16/19 0507 11/16/19 1325 11/16/19 2114 11/17/19 0646  BP: 110/67 98/63 100/66 (!) 93/52  Pulse: 78 75 86 91  Resp: 16 17 16    Temp: 98.2 F (36.8 C) 98.3 F (36.8 C) 98.5 F (36.9 C) 98 F (36.7 C)  TempSrc: Oral Oral Oral Oral  SpO2: 99%  97% 98%  Weight:      Height:        General: Pt is alert, awake, not in acute distress Cardiovascular: RRR, S1/S2 +, no rubs, no gallops Respiratory: fine bibasilar crackles. No wheeze Abdominal: Soft, NT, ND, bowel sounds + Extremities: no edema, no cyanosis   The results of significant diagnostics from this hospitalization (including imaging, microbiology, ancillary and laboratory) are listed below for reference.    Significant Diagnostic Studies: DG Knee 1-2 Views Left  Result Date: 10/25/2019 CLINICAL DATA:  ORIF left patella Radiation Safety Timeout performed by TMS LMP verified prior to exam, TMS Tech wore surgical mask/unable to visualize ptSurgery elective EXAM: LEFT KNEE - 1-2 VIEW; DG C-ARM 1-60 MIN COMPARISON:  10/13/2019 FINDINGS: Patient has undergone ORIF of the patella with 2 cortical screws. No new fracture. IMPRESSION: ORIF of the patella. Electronically Signed   By:  Norva Pavlov M.D.   On: 10/25/2019 15:37   CT Head Wo Contrast  Result Date: 10/29/2019 CLINICAL DATA:  Confusion EXAM: CT HEAD WITHOUT CONTRAST TECHNIQUE: Contiguous axial images were obtained from the base of the skull through the vertex without intravenous contrast. COMPARISON:  09/26/2016 FINDINGS: Brain: Mild atrophic changes are noted commenced with the patient's given age. No findings to suggest  acute hemorrhage or space-occupying mass lesion are noted. Scattered white matter ischemic changes are noted. Vascular: No hyperdense vessel or unexpected calcification. Skull: Normal. Negative for fracture or focal lesion. Sinuses/Orbits: No acute finding. Other: None. IMPRESSION: Mild atrophic and ischemic changes without acute abnormality. Electronically Signed   By: Alcide Clever M.D.   On: 10/29/2019 23:37   DG Chest Port 1 View  Result Date: 10/29/2019 CLINICAL DATA:  Confusion and shortness of breath EXAM: PORTABLE CHEST 1 VIEW COMPARISON:  12/01/17 FINDINGS: Cardiac shadow is stable. Aortic calcifications are again seen. The lungs are well aerated bilaterally. No focal infiltrate or sizable effusion is seen. Degenerative changes of the shoulder joints are noted bilaterally. IMPRESSION: No acute abnormality noted. Aortic Atherosclerosis (ICD10-I70.0). Electronically Signed   By: Alcide Clever M.D.   On: 10/29/2019 23:38   DG C-Arm 1-60 Min  Result Date: 10/25/2019 CLINICAL DATA:  ORIF left patella Radiation Safety Timeout performed by TMS LMP verified prior to exam, TMS Tech wore surgical mask/unable to visualize ptSurgery elective EXAM: LEFT KNEE - 1-2 VIEW; DG C-ARM 1-60 MIN COMPARISON:  10/13/2019 FINDINGS: Patient has undergone ORIF of the patella with 2 cortical screws. No new fracture. IMPRESSION: ORIF of the patella. Electronically Signed   By: Norva Pavlov M.D.   On: 10/25/2019 15:37     Microbiology: Recent Results (from the past 240 hour(s))  Urine culture     Status: Abnormal   Collection Time: 11/15/19 10:00 AM   Specimen: Urine, Clean Catch  Result Value Ref Range Status   Specimen Description   Final    URINE, CLEAN CATCH Performed at [facility], [address] [place], [place] [address]    Special Requests   Final    NONE Performed at [facility], [address], [place], [place] [address]    Culture (A)  Final    <10,000 COLONIES/mL INSIGNIFICANT  GROWTH Performed at [facility], [address], [place], [place] [address]    Report Status 11/17/2019 FINAL  Final  GI pathogen panel by PCR, stool     Status: Abnormal   Collection Time: 11/15/19 10:40 AM   Specimen: STOOL  Result Value Ref Range Status   Plesiomonas shigelloides NOT DETECTED NOT DETECTED Final   Yersinia enterocolitica NOT DETECTED NOT DETECTED Final   Vibrio NOT DETECTED NOT DETECTED Final   Enteropathogenic E coli DETECTED (A) NOT DETECTED Final   E coli (ETEC) LT/ST NOT DETECTED NOT DETECTED Final   E coli 0157 by PCR Not applicable NOT DETECTED Final   Cryptosporidium by PCR NOT DETECTED NOT DETECTED Final   Entamoeba histolytica NOT DETECTED NOT DETECTED Final   Adenovirus F 40/41 NOT DETECTED NOT DETECTED Final   Norovirus GI/GII NOT DETECTED NOT DETECTED Final   Sapovirus NOT DETECTED NOT DETECTED Final    Comment: (NOTE) Performed At: [facility] [address] [place], [place] [id number] Jolene Schimke MD IE:[id number]    Vibrio cholerae NOT DETECTED NOT DETECTED Final   Campylobacter by PCR NOT DETECTED NOT DETECTED Final   Salmonella by PCR NOT DETECTED NOT DETECTED Final  E coli (STEC) NOT DETECTED NOT DETECTED Final   Enteroaggregative E coli NOT DETECTED NOT DETECTED Final   Shigella by PCR NOT DETECTED NOT DETECTED Final   Cyclospora cayetanensis NOT DETECTED NOT DETECTED Final   Astrovirus NOT DETECTED NOT DETECTED Final   G lamblia by PCR NOT DETECTED NOT DETECTED Final   Rotavirus A by PCR NOT DETECTED NOT DETECTED Final  C Difficile Quick Screen w PCR reflex     Status: Abnormal   Collection Time: 11/15/19 10:40 AM   Specimen: STOOL  Result Value Ref Range Status   C Diff antigen POSITIVE (A) NEGATIVE Final    Comment: RESULT CALLED TO, READ BACK BY AND VERIFIED WITH: MINTER,R@[id number]  BY MATTHEWS, B 12.29.2020    C Diff toxin NEGATIVE NEGATIVE Final   C Diff interpretation Results are indeterminate. See PCR results.   Final    Comment: Performed at [facility], [address] [place], [place] [address]  C. Diff by PCR, Reflexed     Status: Abnormal   Collection Time: 11/15/19 10:40 AM  Result Value Ref Range Status   Toxigenic C. Difficile by PCR POSITIVE (A) NEGATIVE Final    Comment: Positive for toxigenic C. difficile with little to no toxin production. Only treat if clinical presentation suggests symptomatic illness. Performed at [facility], [place] [address], [place], [place] [address]      Labs: Basic Metabolic Panel: Recent Labs  Lab 11/15/19 1226 11/16/19 0644 11/17/19 0750  NA 138 142 140  K 5.1 4.6 3.3*  CL 100 108 108  CO2 19* 18* 19*  GLUCOSE 118* 93 85  BUN 47* 36* 14  CREATININE 1.40* 0.90 0.59  CALCIUM 9.3 8.9 8.6*  MG  --   --  1.7   Liver Function Tests: Recent Labs  Lab 11/15/19 1226  AST 19  ALT 12  ALKPHOS 40  BILITOT 1.3*  PROT 7.9  ALBUMIN 3.3*   No results for input(s): LIPASE, AMYLASE in the last 168 hours. No results for input(s): AMMONIA in the last 168 hours. CBC: Recent Labs  Lab 11/15/19 1226 11/16/19 0644 11/17/19 0750  WBC 11.0* 9.8 7.3  NEUTROABS 8.5*  --   --   HGB 13.9 12.5 10.4*  HCT 44.8 41.5 34.8*  MCV 95.3 98.1 97.5  PLT 252 215 189   Cardiac Enzymes: No results for input(s): CKTOTAL, CKMB, CKMBINDEX, TROPONINI in the last 168 hours. BNP: Invalid input(s): POCBNP CBG: Recent Labs  Lab 11/16/19 2114  GLUCAP 93    Time coordinating discharge:  36 minutes  Signed:  Orson Eva, DO [facility] Pager: [phone] 11/17/2019, 1:53 PM

## 2019-11-17 NOTE — Plan of Care (Signed)

## 2019-11-18 DIAGNOSIS — I10 Essential (primary) hypertension: Secondary | ICD-10-CM | POA: Diagnosis not present

## 2019-11-18 DIAGNOSIS — A0472 Enterocolitis due to Clostridium difficile, not specified as recurrent: Secondary | ICD-10-CM | POA: Diagnosis not present

## 2019-11-18 DIAGNOSIS — Z743 Need for continuous supervision: Secondary | ICD-10-CM | POA: Diagnosis not present

## 2019-11-18 DIAGNOSIS — D649 Anemia, unspecified: Secondary | ICD-10-CM | POA: Diagnosis not present

## 2019-11-18 DIAGNOSIS — Z4789 Encounter for other orthopedic aftercare: Secondary | ICD-10-CM | POA: Diagnosis not present

## 2019-11-18 DIAGNOSIS — R262 Difficulty in walking, not elsewhere classified: Secondary | ICD-10-CM | POA: Diagnosis not present

## 2019-11-18 DIAGNOSIS — R5381 Other malaise: Secondary | ICD-10-CM | POA: Diagnosis not present

## 2019-11-18 DIAGNOSIS — Z86711 Personal history of pulmonary embolism: Secondary | ICD-10-CM | POA: Diagnosis not present

## 2019-11-18 DIAGNOSIS — U071 COVID-19: Secondary | ICD-10-CM | POA: Diagnosis not present

## 2019-11-18 DIAGNOSIS — N390 Urinary tract infection, site not specified: Secondary | ICD-10-CM | POA: Diagnosis not present

## 2019-11-18 DIAGNOSIS — M6281 Muscle weakness (generalized): Secondary | ICD-10-CM | POA: Diagnosis not present

## 2019-11-18 DIAGNOSIS — A0471 Enterocolitis due to Clostridium difficile, recurrent: Secondary | ICD-10-CM | POA: Diagnosis not present

## 2019-11-18 DIAGNOSIS — R69 Illness, unspecified: Secondary | ICD-10-CM | POA: Diagnosis not present

## 2019-11-18 DIAGNOSIS — S82002D Unspecified fracture of left patella, subsequent encounter for closed fracture with routine healing: Secondary | ICD-10-CM | POA: Diagnosis not present

## 2019-11-18 NOTE — Progress Notes (Signed)
EMS in transport pt to [facility]. Vitals signs stable. Pt Alert and oriented. Pt wearing a mask upon departure.

## 2019-11-21 DIAGNOSIS — U071 COVID-19: Secondary | ICD-10-CM | POA: Diagnosis not present

## 2019-11-21 DIAGNOSIS — S82002D Unspecified fracture of left patella, subsequent encounter for closed fracture with routine healing: Secondary | ICD-10-CM | POA: Diagnosis not present

## 2019-11-21 DIAGNOSIS — I10 Essential (primary) hypertension: Secondary | ICD-10-CM | POA: Diagnosis not present

## 2019-11-21 DIAGNOSIS — A0472 Enterocolitis due to Clostridium difficile, not specified as recurrent: Secondary | ICD-10-CM | POA: Diagnosis not present

## 2019-12-11 DIAGNOSIS — U071 COVID-19: Secondary | ICD-10-CM | POA: Diagnosis not present

## 2019-12-11 DIAGNOSIS — B370 Candidal stomatitis: Secondary | ICD-10-CM | POA: Diagnosis not present

## 2019-12-11 DIAGNOSIS — A0472 Enterocolitis due to Clostridium difficile, not specified as recurrent: Secondary | ICD-10-CM | POA: Diagnosis not present

## 2019-12-22 DIAGNOSIS — N390 Urinary tract infection, site not specified: Secondary | ICD-10-CM | POA: Diagnosis not present

## 2019-12-22 DIAGNOSIS — I10 Essential (primary) hypertension: Secondary | ICD-10-CM | POA: Diagnosis not present

## 2019-12-22 DIAGNOSIS — A0472 Enterocolitis due to Clostridium difficile, not specified as recurrent: Secondary | ICD-10-CM | POA: Diagnosis not present

## 2019-12-27 DIAGNOSIS — D649 Anemia, unspecified: Secondary | ICD-10-CM | POA: Diagnosis not present

## 2019-12-27 DIAGNOSIS — D6489 Other specified anemias: Secondary | ICD-10-CM | POA: Diagnosis not present

## 2019-12-28 DIAGNOSIS — I4891 Unspecified atrial fibrillation: Secondary | ICD-10-CM | POA: Diagnosis not present

## 2019-12-28 DIAGNOSIS — I499 Cardiac arrhythmia, unspecified: Secondary | ICD-10-CM | POA: Diagnosis not present

## 2019-12-30 DIAGNOSIS — D649 Anemia, unspecified: Secondary | ICD-10-CM | POA: Diagnosis not present

## 2019-12-30 DIAGNOSIS — E860 Dehydration: Secondary | ICD-10-CM | POA: Diagnosis not present

## 2020-01-03 DIAGNOSIS — I10 Essential (primary) hypertension: Secondary | ICD-10-CM | POA: Diagnosis not present

## 2020-01-03 DIAGNOSIS — E860 Dehydration: Secondary | ICD-10-CM | POA: Diagnosis not present

## 2020-01-08 DIAGNOSIS — R319 Hematuria, unspecified: Secondary | ICD-10-CM | POA: Diagnosis not present

## 2020-01-08 DIAGNOSIS — N390 Urinary tract infection, site not specified: Secondary | ICD-10-CM | POA: Diagnosis not present

## 2020-01-09 DIAGNOSIS — A0472 Enterocolitis due to Clostridium difficile, not specified as recurrent: Secondary | ICD-10-CM | POA: Diagnosis not present

## 2020-01-09 DIAGNOSIS — N390 Urinary tract infection, site not specified: Secondary | ICD-10-CM | POA: Diagnosis not present

## 2020-01-12 DIAGNOSIS — S82002D Unspecified fracture of left patella, subsequent encounter for closed fracture with routine healing: Secondary | ICD-10-CM | POA: Diagnosis not present

## 2020-01-12 DIAGNOSIS — N390 Urinary tract infection, site not specified: Secondary | ICD-10-CM | POA: Diagnosis not present

## 2020-01-24 DIAGNOSIS — A0472 Enterocolitis due to Clostridium difficile, not specified as recurrent: Secondary | ICD-10-CM | POA: Diagnosis not present

## 2020-01-24 DIAGNOSIS — I10 Essential (primary) hypertension: Secondary | ICD-10-CM | POA: Diagnosis not present

## 2020-02-04 DIAGNOSIS — R110 Nausea: Secondary | ICD-10-CM | POA: Diagnosis not present

## 2020-02-05 DIAGNOSIS — I4891 Unspecified atrial fibrillation: Secondary | ICD-10-CM | POA: Diagnosis not present

## 2020-02-05 DIAGNOSIS — R0689 Other abnormalities of breathing: Secondary | ICD-10-CM | POA: Diagnosis not present

## 2020-02-05 DIAGNOSIS — R092 Respiratory arrest: Secondary | ICD-10-CM | POA: Diagnosis not present

## 2020-02-05 DIAGNOSIS — R404 Transient alteration of awareness: Secondary | ICD-10-CM | POA: Diagnosis not present

## 2020-02-05 DIAGNOSIS — R5383 Other fatigue: Secondary | ICD-10-CM | POA: Diagnosis not present

## 2020-02-05 DIAGNOSIS — R000 Tachycardia, unspecified: Secondary | ICD-10-CM | POA: Diagnosis not present

## 2020-02-05 DIAGNOSIS — Z743 Need for continuous supervision: Secondary | ICD-10-CM | POA: Diagnosis not present

## 2020-02-16 DEATH — deceased

## 2021-01-27 IMAGING — RF DG KNEE 1-2V*L*
1 series · 2 of 2 positions shown · non-contrast
Comparison: 10/13/2019

CLINICAL DATA: ORIF left patella Radiation Safety Timeout performed
by MILLCHEVA LMP verified prior to exam, MILLCHEVA Tech wore surgical
mask/unable to visualize ptSurgery elective

EXAM:
LEFT KNEE - 1-2 VIEW; DG C-ARM 1-60 MIN

[Series 1: run · 2 of 2 slices shown]
[im 1/2]
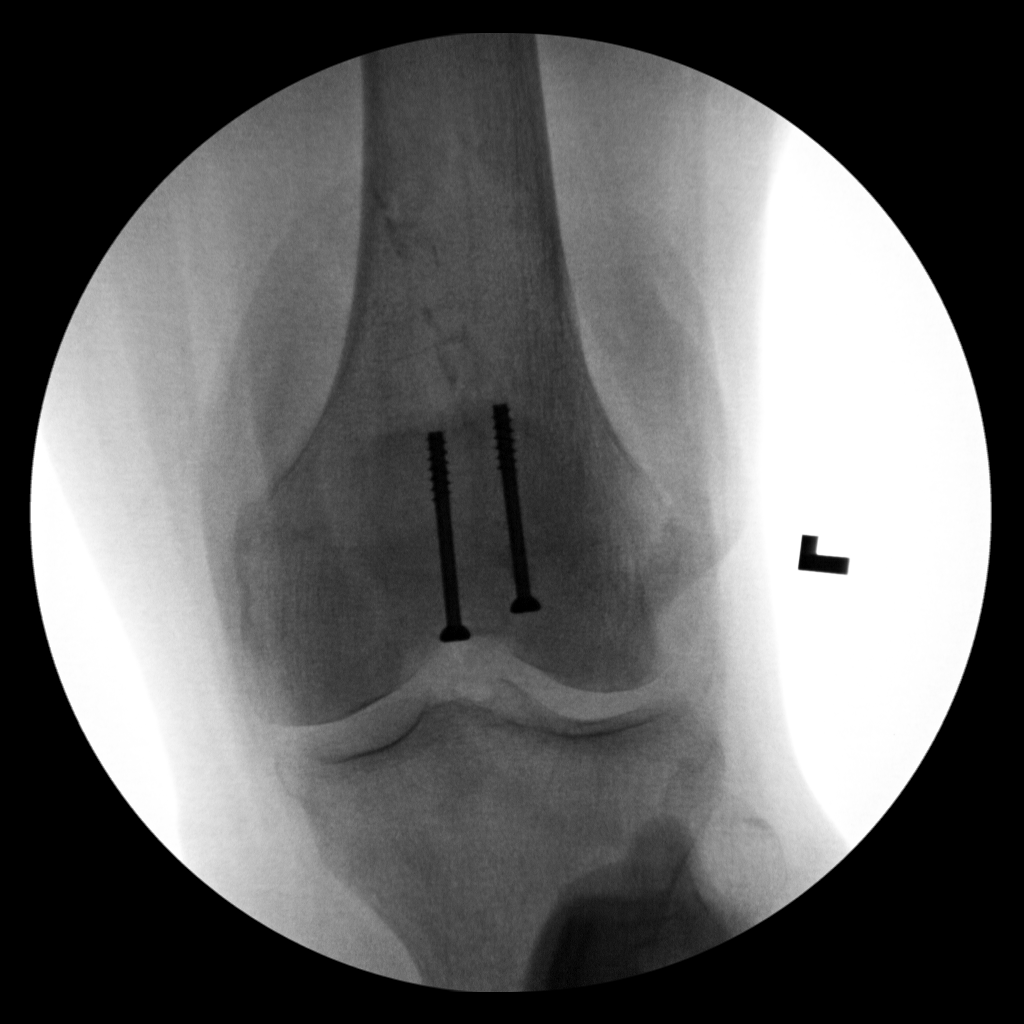
[im 2/2]
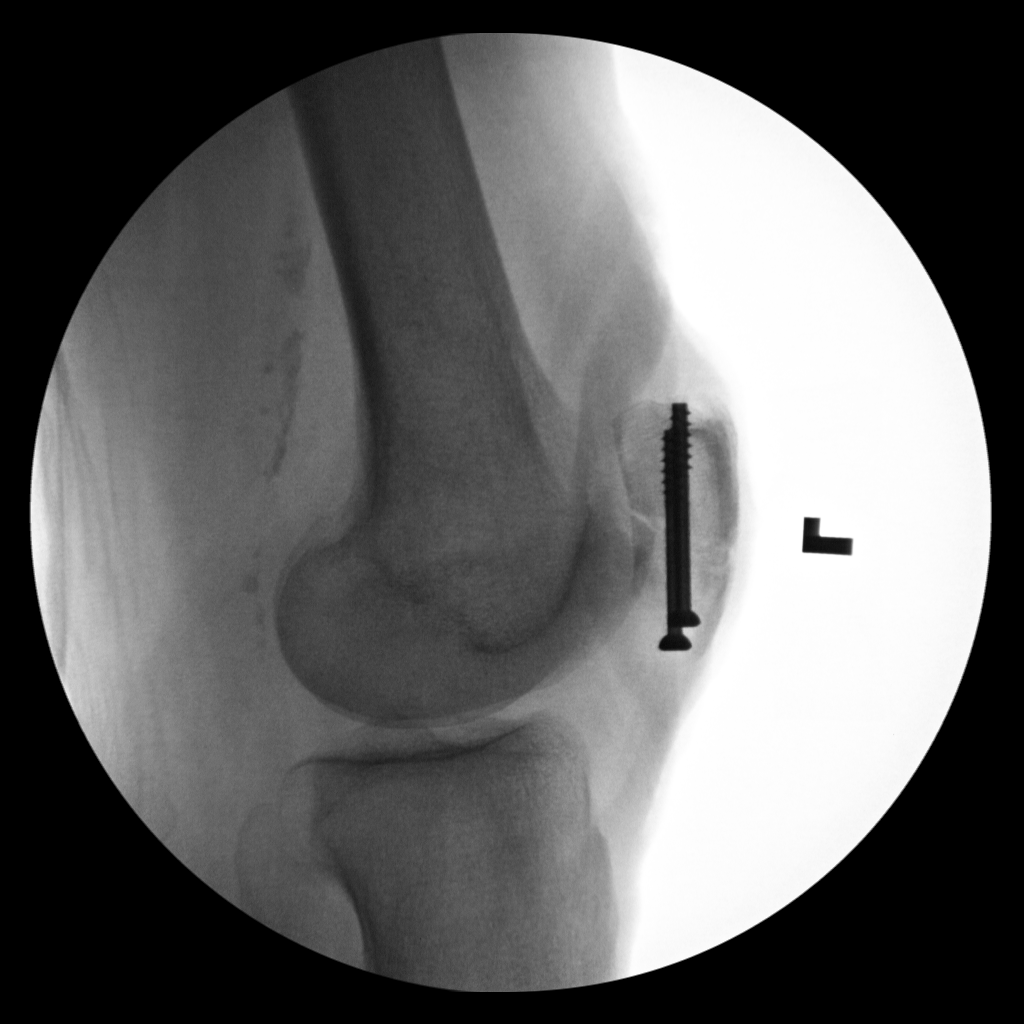

[2 of 2 positions shown; findings below may reference images not displayed]

FINDINGS: Patient has undergone ORIF of the patella with 2 cortical screws. No
new fracture.
IMPRESSION: ORIF of the patella.

## 2021-01-27 IMAGING — RF DG C-ARM 1-60 MIN
1 series · 2 of 2 positions shown · non-contrast
Comparison: 10/13/2019

CLINICAL DATA: ORIF left patella Radiation Safety Timeout performed
by MILLCHEVA LMP verified prior to exam, MILLCHEVA Tech wore surgical
mask/unable to visualize ptSurgery elective

EXAM:
LEFT KNEE - 1-2 VIEW; DG C-ARM 1-60 MIN

[Series 1: run · 2 of 2 slices shown]
[im 1/2]
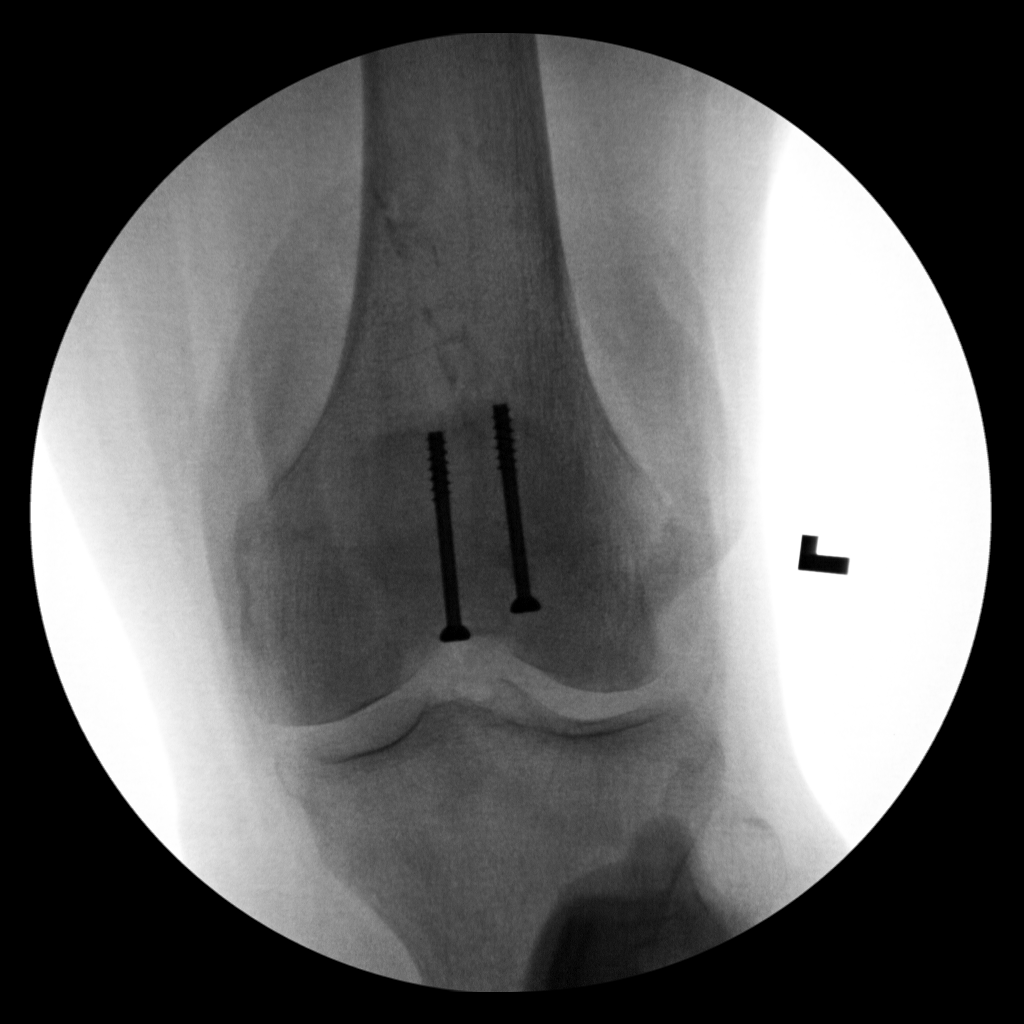
[im 2/2]
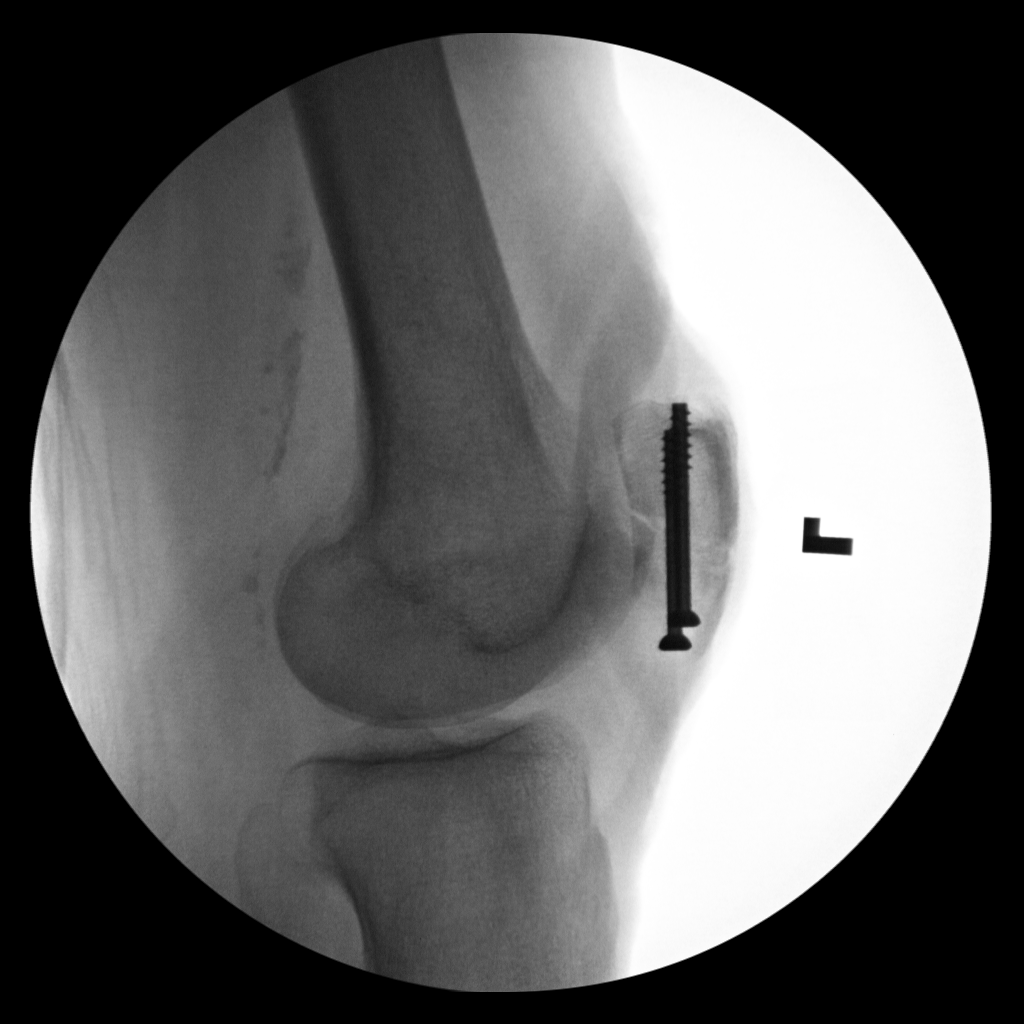

[2 of 2 positions shown; findings below may reference images not displayed]

FINDINGS: Patient has undergone ORIF of the patella with 2 cortical screws. No
new fracture.
IMPRESSION: ORIF of the patella.

## 2021-01-31 IMAGING — CT CT HEAD W/O CM
4 series · 17 of 47 positions shown, 19 images · non-contrast
Comparison: 09/26/2016

CLINICAL DATA: Confusion

EXAM:
CT HEAD WITHOUT CONTRAST
TECHNIQUE: Contiguous axial images were obtained from the base of the skull
through the vertex without intravenous contrast.

[Series 2: head w o · axial · 0.47mm/px · z∈[+169,+284]mm · 7 of 31 slices shown, 9 images]
[im 4/31  brain]
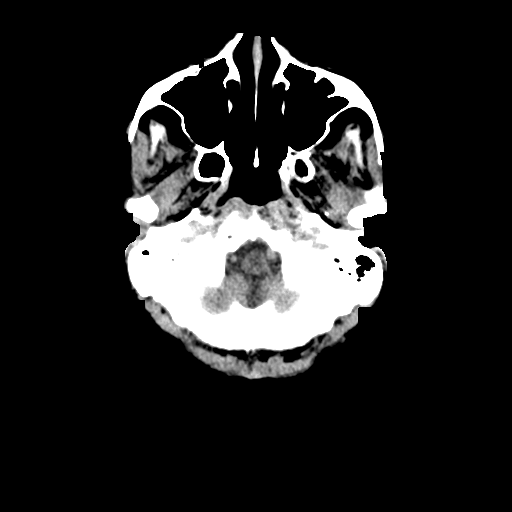
[im 4/31  bone]
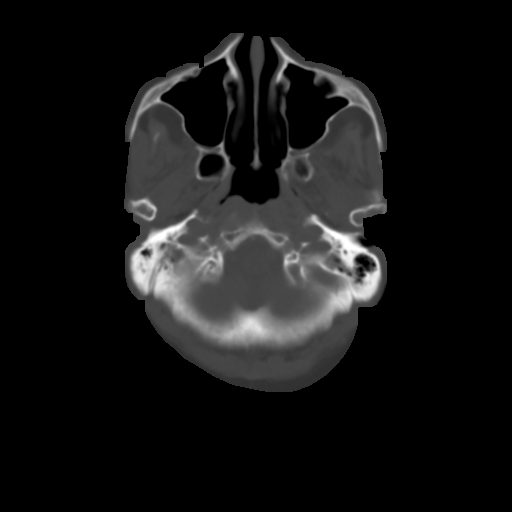
[im 8/31  brain]
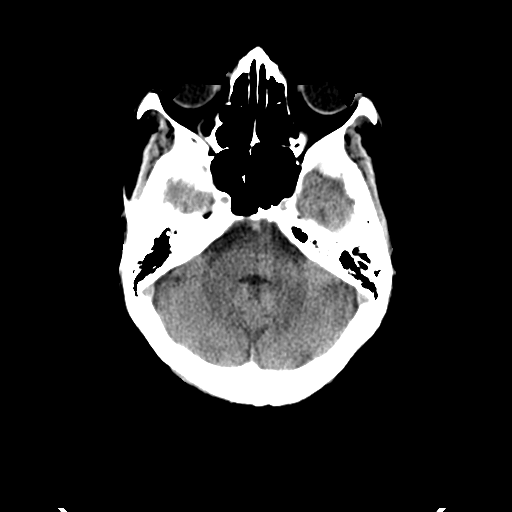
[im 12/31  brain]
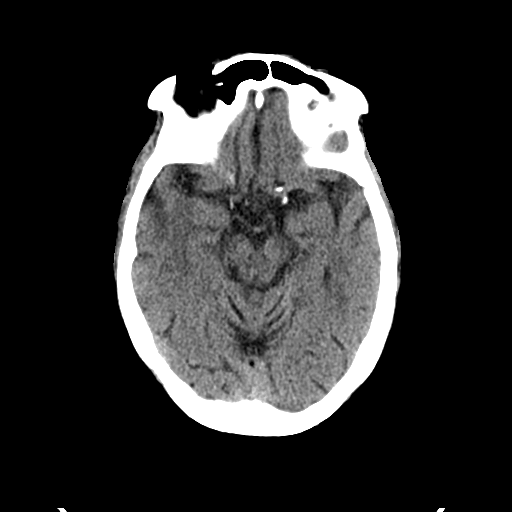
[im 16/31  brain]
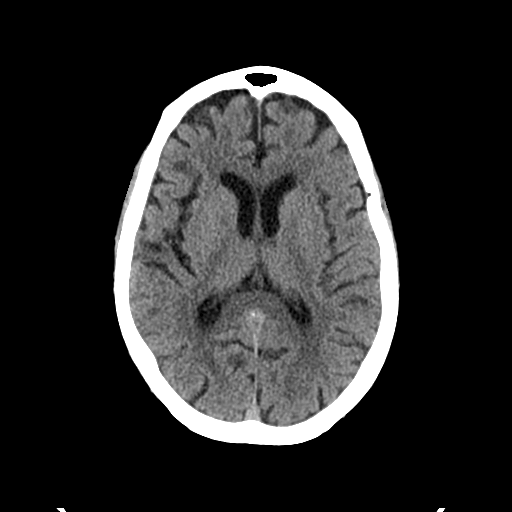
[im 19/31  brain]
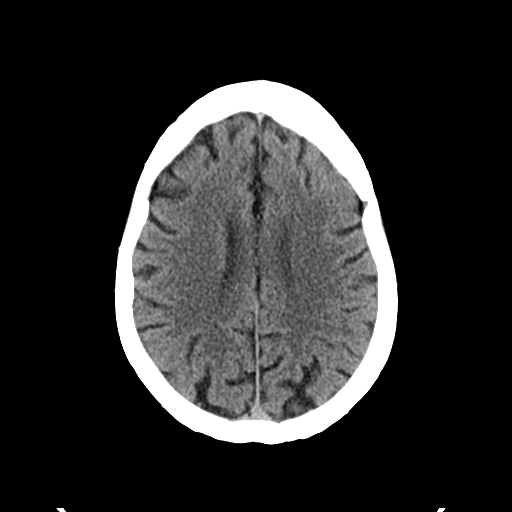
[im 19/31  bone]
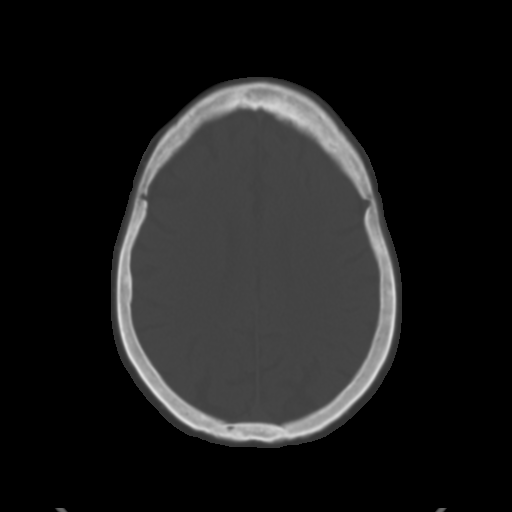
[im 23/31  brain]
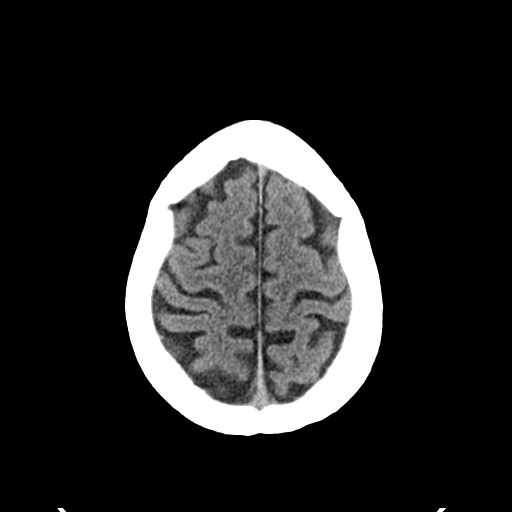
[im 27/31  brain]
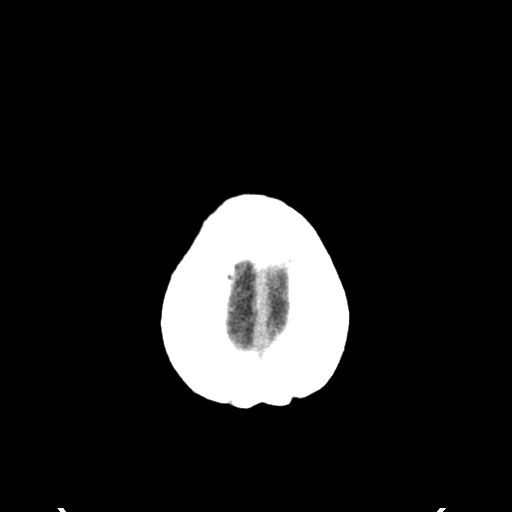

[Series 3: head bone · axial · 0.47mm/px · z∈[+168,+222]mm · 4 of 77 slices shown]
[im 8/77  bone]
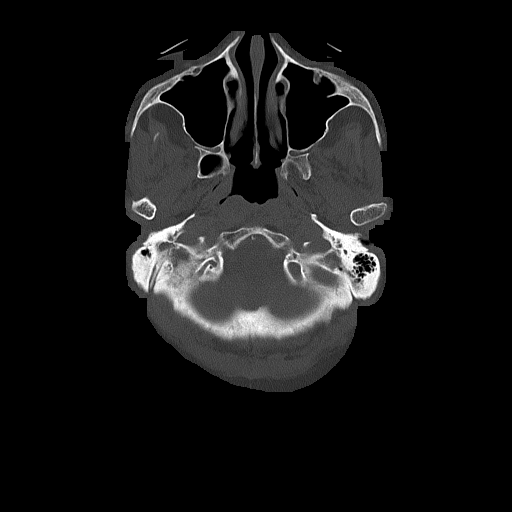
[im 16/77  bone]
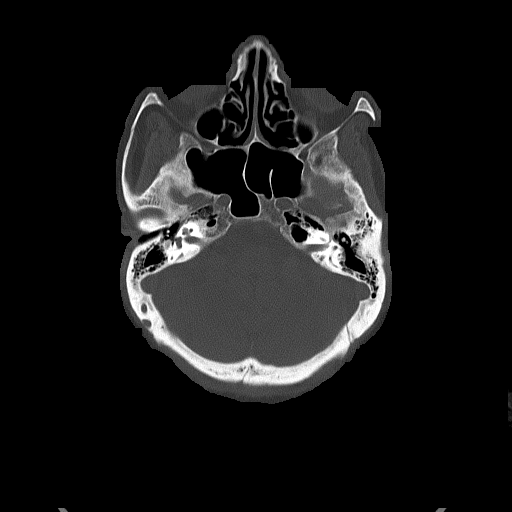
[im 23/77  bone]
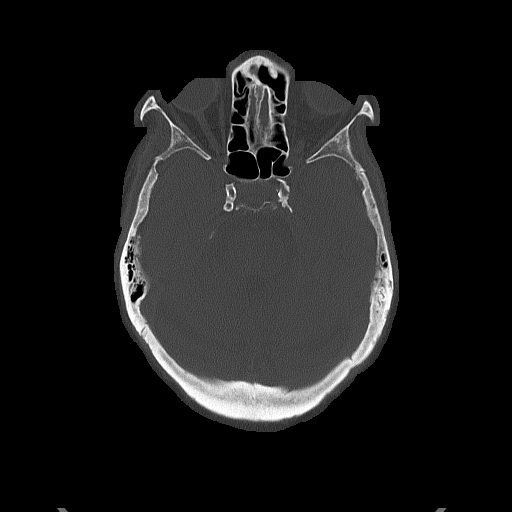
[im 35/77  bone]
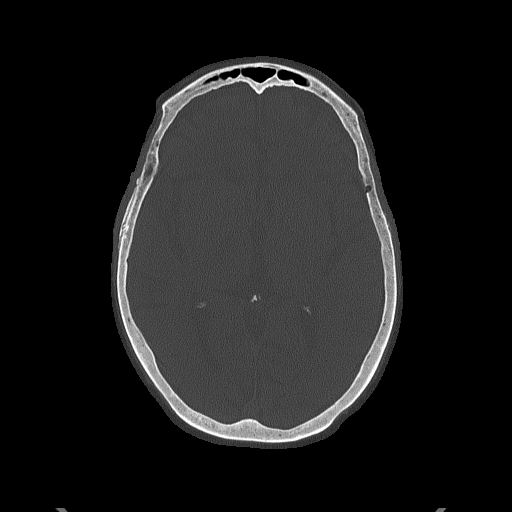

[Series 4: coronal soft · coronal · 0.34mm/px · 3 of 69 slices shown]
[im 23/69  brain]
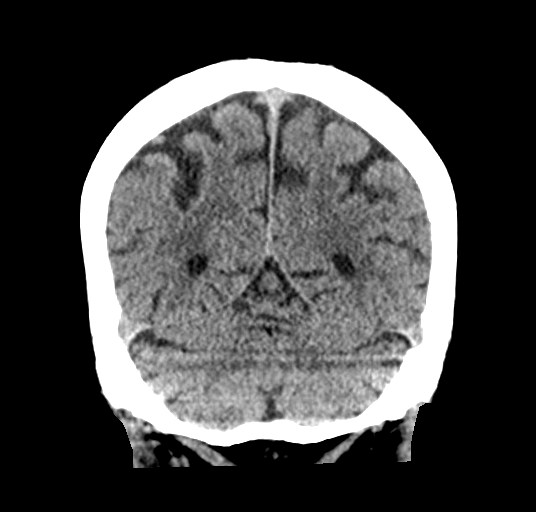
[im 31/69  brain]
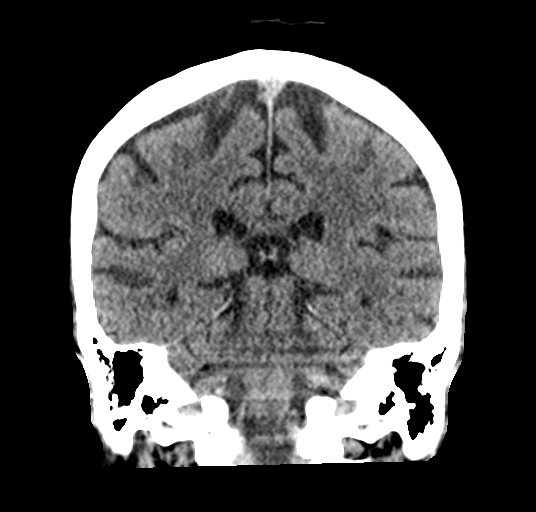
[im 38/69  brain]
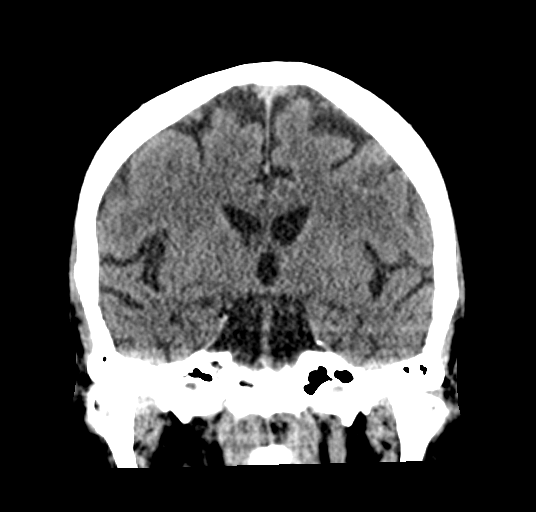

[Series 5: sagittal soft · sagittal · 0.33mm/px · 3 of 57 slices shown]
[im 19/57  brain]
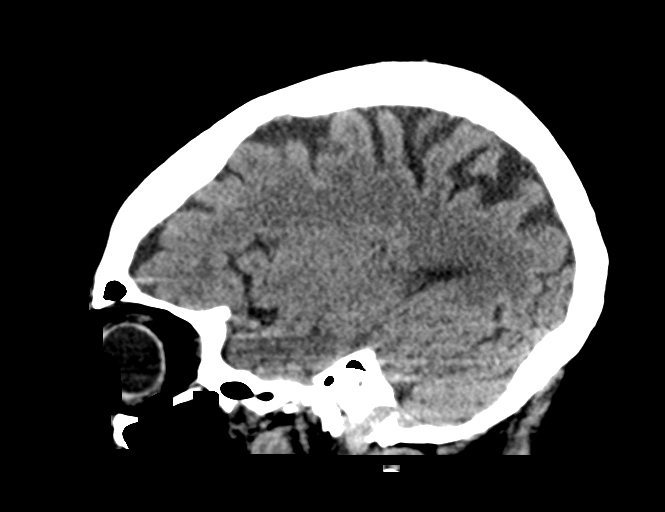
[im 29/57  brain]
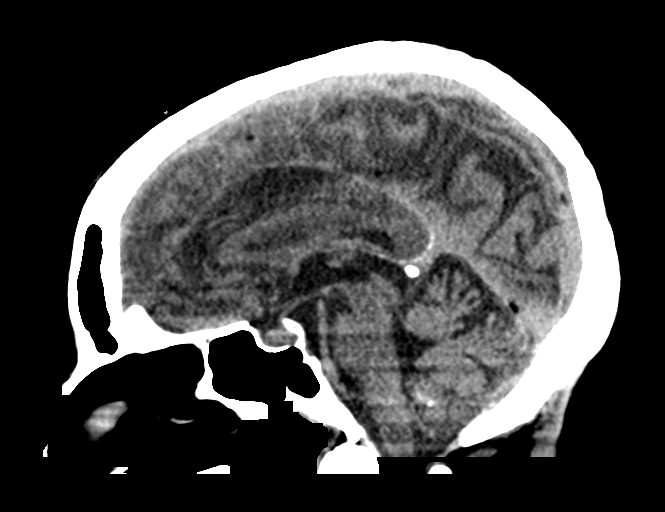
[im 38/57  brain]
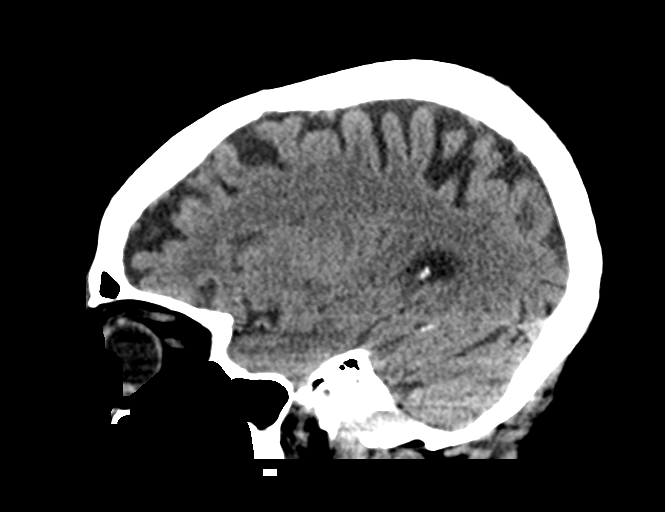

[17 of 47 positions shown; findings below may reference images not displayed]

FINDINGS: Brain: Mild atrophic changes are noted commenced with the patient's
given age. No findings to suggest acute hemorrhage or
space-occupying mass lesion are noted. Scattered white matter
ischemic changes are noted.

Vascular: No hyperdense vessel or unexpected calcification.

Skull: Normal. Negative for fracture or focal lesion.

Sinuses/Orbits: No acute finding.

Other: None.
IMPRESSION: Mild atrophic and ischemic changes without acute abnormality.
